# Patient Record
Sex: Female | Born: 1950 | Race: White | Hispanic: No | Marital: Married | Smoking: Never smoker
Health system: Southern US, Community
[De-identification: ages and names within clinical notes are randomized; demographics above are authoritative.]

## PROBLEM LIST (undated history)

## (undated) DIAGNOSIS — F329 Major depressive disorder, single episode, unspecified: Secondary | ICD-10-CM

## (undated) DIAGNOSIS — F32A Depression, unspecified: Secondary | ICD-10-CM

## (undated) DIAGNOSIS — N920 Excessive and frequent menstruation with regular cycle: Secondary | ICD-10-CM

## (undated) DIAGNOSIS — E042 Nontoxic multinodular goiter: Secondary | ICD-10-CM

## (undated) HISTORY — DX: Excessive and frequent menstruation with regular cycle: N92.0

## (undated) HISTORY — DX: Major depressive disorder, single episode, unspecified: F32.9

## (undated) HISTORY — PX: OTHER SURGICAL HISTORY: SHX169

## (undated) HISTORY — PX: TUBAL LIGATION: SHX77

## (undated) HISTORY — DX: Nontoxic multinodular goiter: E04.2

## (undated) HISTORY — DX: Depression, unspecified: F32.A

## (undated) HISTORY — PX: OOPHORECTOMY: SHX86

---

## 1999-06-15 ENCOUNTER — Encounter: Admission: RE | Admit: 1999-06-15 | Discharge: 1999-06-15 | Payer: Self-pay | Admitting: Obstetrics and Gynecology

## 2000-06-29 ENCOUNTER — Encounter: Payer: Self-pay | Admitting: Obstetrics and Gynecology

## 2000-06-29 ENCOUNTER — Encounter: Admission: RE | Admit: 2000-06-29 | Discharge: 2000-06-29 | Payer: Self-pay | Admitting: Obstetrics and Gynecology

## 2000-10-13 ENCOUNTER — Other Ambulatory Visit: Admission: RE | Admit: 2000-10-13 | Discharge: 2000-10-13 | Payer: Self-pay | Admitting: Gynecology

## 2000-12-27 ENCOUNTER — Other Ambulatory Visit: Admission: RE | Admit: 2000-12-27 | Discharge: 2000-12-27 | Payer: Self-pay | Admitting: Gynecology

## 2000-12-27 ENCOUNTER — Encounter (INDEPENDENT_AMBULATORY_CARE_PROVIDER_SITE_OTHER): Payer: Self-pay | Admitting: Specialist

## 2001-06-26 ENCOUNTER — Inpatient Hospital Stay (HOSPITAL_COMMUNITY): Admission: RE | Admit: 2001-06-26 | Discharge: 2001-06-28 | Payer: Self-pay | Admitting: Gynecology

## 2001-06-26 ENCOUNTER — Encounter (INDEPENDENT_AMBULATORY_CARE_PROVIDER_SITE_OTHER): Payer: Self-pay | Admitting: Specialist

## 2001-06-26 HISTORY — PX: ABDOMINAL HYSTERECTOMY: SHX81

## 2001-07-27 ENCOUNTER — Encounter: Payer: Self-pay | Admitting: Gynecology

## 2001-07-27 ENCOUNTER — Encounter: Admission: RE | Admit: 2001-07-27 | Discharge: 2001-07-27 | Payer: Self-pay | Admitting: Gynecology

## 2001-10-13 ENCOUNTER — Other Ambulatory Visit: Admission: RE | Admit: 2001-10-13 | Discharge: 2001-10-13 | Payer: Self-pay | Admitting: Gynecology

## 2002-08-03 ENCOUNTER — Encounter: Admission: RE | Admit: 2002-08-03 | Discharge: 2002-08-03 | Payer: Self-pay | Admitting: Gynecology

## 2002-08-03 ENCOUNTER — Encounter: Payer: Self-pay | Admitting: Gynecology

## 2002-10-15 ENCOUNTER — Other Ambulatory Visit: Admission: RE | Admit: 2002-10-15 | Discharge: 2002-10-15 | Payer: Self-pay | Admitting: Gynecology

## 2003-09-20 ENCOUNTER — Encounter: Admission: RE | Admit: 2003-09-20 | Discharge: 2003-09-20 | Payer: Self-pay | Admitting: Gynecology

## 2003-10-18 ENCOUNTER — Other Ambulatory Visit: Admission: RE | Admit: 2003-10-18 | Discharge: 2003-10-18 | Payer: Self-pay | Admitting: Gynecology

## 2004-09-25 ENCOUNTER — Encounter: Admission: RE | Admit: 2004-09-25 | Discharge: 2004-09-25 | Payer: Self-pay | Admitting: Gynecology

## 2004-10-21 ENCOUNTER — Other Ambulatory Visit: Admission: RE | Admit: 2004-10-21 | Discharge: 2004-10-21 | Payer: Self-pay | Admitting: Obstetrics and Gynecology

## 2004-11-16 ENCOUNTER — Ambulatory Visit (HOSPITAL_COMMUNITY): Admission: RE | Admit: 2004-11-16 | Discharge: 2004-11-16 | Payer: Self-pay | Admitting: Gastroenterology

## 2005-09-28 ENCOUNTER — Encounter: Admission: RE | Admit: 2005-09-28 | Discharge: 2005-09-28 | Payer: Self-pay | Admitting: Obstetrics and Gynecology

## 2005-10-14 ENCOUNTER — Encounter: Admission: RE | Admit: 2005-10-14 | Discharge: 2005-10-14 | Payer: Self-pay | Admitting: Obstetrics and Gynecology

## 2005-10-22 ENCOUNTER — Other Ambulatory Visit: Admission: RE | Admit: 2005-10-22 | Discharge: 2005-10-22 | Payer: Self-pay | Admitting: Obstetrics and Gynecology

## 2006-09-30 ENCOUNTER — Encounter: Admission: RE | Admit: 2006-09-30 | Discharge: 2006-09-30 | Payer: Self-pay | Admitting: Obstetrics and Gynecology

## 2006-10-26 ENCOUNTER — Other Ambulatory Visit: Admission: RE | Admit: 2006-10-26 | Discharge: 2006-10-26 | Payer: Self-pay | Admitting: Obstetrics and Gynecology

## 2007-10-02 ENCOUNTER — Encounter: Admission: RE | Admit: 2007-10-02 | Discharge: 2007-10-02 | Payer: Self-pay | Admitting: Obstetrics and Gynecology

## 2007-11-09 ENCOUNTER — Other Ambulatory Visit: Admission: RE | Admit: 2007-11-09 | Discharge: 2007-11-09 | Payer: Self-pay | Admitting: Obstetrics and Gynecology

## 2008-10-02 ENCOUNTER — Encounter: Admission: RE | Admit: 2008-10-02 | Discharge: 2008-10-02 | Payer: Self-pay | Admitting: Obstetrics and Gynecology

## 2008-11-11 ENCOUNTER — Other Ambulatory Visit: Admission: RE | Admit: 2008-11-11 | Discharge: 2008-11-11 | Payer: Self-pay | Admitting: Obstetrics and Gynecology

## 2008-11-11 ENCOUNTER — Ambulatory Visit: Payer: Self-pay | Admitting: Obstetrics and Gynecology

## 2008-11-11 ENCOUNTER — Encounter: Payer: Self-pay | Admitting: Obstetrics and Gynecology

## 2008-11-21 ENCOUNTER — Ambulatory Visit: Payer: Self-pay | Admitting: Obstetrics and Gynecology

## 2009-03-25 ENCOUNTER — Encounter: Admission: RE | Admit: 2009-03-25 | Discharge: 2009-03-25 | Payer: Self-pay | Admitting: Gastroenterology

## 2009-09-06 HISTORY — PX: THYROIDECTOMY, PARTIAL: SHX18

## 2009-10-03 ENCOUNTER — Encounter: Admission: RE | Admit: 2009-10-03 | Discharge: 2009-10-03 | Payer: Self-pay | Admitting: Obstetrics and Gynecology

## 2009-11-13 ENCOUNTER — Other Ambulatory Visit: Admission: RE | Admit: 2009-11-13 | Discharge: 2009-11-13 | Payer: Self-pay | Admitting: Obstetrics and Gynecology

## 2009-11-13 ENCOUNTER — Ambulatory Visit: Payer: Self-pay | Admitting: Obstetrics and Gynecology

## 2009-12-02 ENCOUNTER — Ambulatory Visit: Payer: Self-pay | Admitting: Obstetrics and Gynecology

## 2009-12-11 ENCOUNTER — Ambulatory Visit: Payer: Self-pay | Admitting: Obstetrics and Gynecology

## 2010-01-05 ENCOUNTER — Ambulatory Visit (HOSPITAL_COMMUNITY): Admission: RE | Admit: 2010-01-05 | Discharge: 2010-01-05 | Payer: Self-pay | Admitting: Obstetrics and Gynecology

## 2010-04-03 ENCOUNTER — Ambulatory Visit (HOSPITAL_COMMUNITY): Admission: RE | Admit: 2010-04-03 | Discharge: 2010-04-03 | Payer: Self-pay | Admitting: Endocrinology

## 2010-05-18 ENCOUNTER — Ambulatory Visit (HOSPITAL_COMMUNITY)
Admission: RE | Admit: 2010-05-18 | Discharge: 2010-05-19 | Payer: Self-pay | Source: Home / Self Care | Admitting: General Surgery

## 2010-05-18 ENCOUNTER — Encounter (INDEPENDENT_AMBULATORY_CARE_PROVIDER_SITE_OTHER): Payer: Self-pay | Admitting: General Surgery

## 2010-09-27 ENCOUNTER — Encounter: Payer: Self-pay | Admitting: Obstetrics and Gynecology

## 2010-10-06 ENCOUNTER — Encounter
Admission: RE | Admit: 2010-10-06 | Discharge: 2010-10-06 | Payer: Self-pay | Source: Home / Self Care | Attending: Obstetrics and Gynecology | Admitting: Obstetrics and Gynecology

## 2010-11-16 ENCOUNTER — Other Ambulatory Visit: Payer: Self-pay | Admitting: Obstetrics and Gynecology

## 2010-11-16 ENCOUNTER — Encounter (INDEPENDENT_AMBULATORY_CARE_PROVIDER_SITE_OTHER): Payer: Managed Care, Other (non HMO) | Admitting: Obstetrics and Gynecology

## 2010-11-16 ENCOUNTER — Other Ambulatory Visit (HOSPITAL_COMMUNITY)
Admission: RE | Admit: 2010-11-16 | Discharge: 2010-11-16 | Disposition: A | Discharge status: Home / Self Care | Payer: Managed Care, Other (non HMO) | Source: Ambulatory Visit | Attending: Obstetrics and Gynecology | Admitting: Obstetrics and Gynecology

## 2010-11-16 DIAGNOSIS — Z124 Encounter for screening for malignant neoplasm of cervix: Secondary | ICD-10-CM | POA: Insufficient documentation

## 2010-11-16 DIAGNOSIS — Z833 Family history of diabetes mellitus: Secondary | ICD-10-CM

## 2010-11-16 DIAGNOSIS — R82998 Other abnormal findings in urine: Secondary | ICD-10-CM

## 2010-11-16 DIAGNOSIS — Z01419 Encounter for gynecological examination (general) (routine) without abnormal findings: Secondary | ICD-10-CM

## 2010-11-16 DIAGNOSIS — Z13220 Encounter for screening for lipoid disorders: Secondary | ICD-10-CM

## 2010-11-19 LAB — DIFFERENTIAL
Basophils Absolute: 0 10*3/uL (ref 0.0–0.1)
Lymphocytes Relative: 32 % (ref 12–46)
Neutro Abs: 5 10*3/uL (ref 1.7–7.7)

## 2010-11-19 LAB — CBC
Hemoglobin: 13.4 g/dL (ref 12.0–15.0)
MCH: 29.5 pg (ref 26.0–34.0)
MCV: 86.8 fL (ref 78.0–100.0)
RBC: 4.54 MIL/uL (ref 3.87–5.11)

## 2010-11-19 LAB — COMPREHENSIVE METABOLIC PANEL
BUN: 10 mg/dL (ref 6–23)
CO2: 25 mEq/L (ref 19–32)
Chloride: 108 mEq/L (ref 96–112)
Creatinine, Ser: 0.7 mg/dL (ref 0.4–1.2)
GFR calc non Af Amer: >60 mL/min (ref >60)
Total Bilirubin: 0.4 mg/dL (ref 0.3–1.2)

## 2010-11-19 LAB — SURGICAL PCR SCREEN: Staphylococcus aureus: NEGATIVE

## 2011-01-22 NOTE — H&P (Signed)
[facility]  Patient:    Cheyenne Davis, Cheyenne Davis Visit Number: [id number] MRN: [id number]          Service Type: Attending:  Gaetano Hawthorne. Lily Peer, M.D. Dictated by:   Gaetano Hawthorne Lily Peer, M.D. Adm. Date:  06/26/01                           History and Physical  SURGERY DATE:  The patient is scheduled for surgery on Monday, June 26, 2001, at 7:30 a.m. at [facility].  CHIEF COMPLAINT: 1. Pelvic pain. 2. Menometrorrhagia. 3. Dysmenorrhea. 4. Right menometrorrhagia ovarian solid mass measuring 11 x 12 mm.  HISTORY OF PRESENT ILLNESS:  The patient is a 60 year old gravida 2, para 2, with previous sterilization procedure who has a longstanding history of dysfunctional uterine bleeding.  Her work-up has included a TSH and prolactin which was normal and she has also had an endometrial biopsy on December 26, 2000, with normal endometrium.  She has had several ultrasounds to follow up on her hyperechoic solid mass on the right ovary which has essentially remained unchanged, which was measured at 11 x 12 mm and is smoothed wall.  There has been no change in the echo pattern from June of this year to September of this year.  The left ovary was difficult to evaluate recently because it was directly upon the uterus.  She has had CA-125 which was normal.  She had also been placed for her menorrhagia on Provera 10 mg for 5 days of each month, minimal resolution.  She complains of dysmenorrhea and dyspareunia.  We had discussed several options to include laparoscopic salpingo-oophorectomy and resectascopic surgery to include endometrial ablation but due to the finding of this nodule on her ovary and her refractory menorrhagia she has decided to proceed with a hysterectomy and we have decided to proceed then with an abdominal approach, since she is now 60 years of age, concurrently to do a bilateral salpingo-oophorectomy.  She was provided with literature  information on all of the above mentioned subjects.  PAST MEDICAL HISTORY:  She is not allergic to any medication.  She has had 1 C-section and one vaginal delivery and also had her tubes tied.  She also has, in the past, had a thyroid nodule which has decreased in size spontaneously and it has not been any problem.  Her thyroid function tests have been normal.  FAMILY HISTORY:  Grandmother with adult onset diabetes mellitus.  Her father with hypertension.  An uncle with colon cancer and a grandmother with heart disease.  She denies any history of sexually transmitted disease in the past.  PHYSICAL EXAMINATION:  VITAL SIGNS:  The patient weight 188 pounds.  She is 5 feet 2 inches tall.  HEENT:  Unremarkable.  NECK:  Supple.  Trachea midline.  No carotid bruits.  No thyromegaly.  LUNGS:  Clear to auscultation without rhonchis or wheezes.  HEART:  Regular rate and rhythm, no murmurs or gallops.  BREAST:  Done during the time of her annual exam in February of this year which was reported to be normal as was her Pap smear.  ABDOMEN:  Soft, nontender without rebound or guarding.  PELVIC:  Bartholin, Skene, urethra within normal limits.  VAGINA AND CERVIX:  No gross lesion on inspection.  Uterus approximately 6-8 weeks size.  Adnexa:  No palpable masses or tenderness.  RECTAL:  Unremarkable.  ASSESSMENT:  A 60 year old gravida 2, para 2, with  history of dysmenorrhea, menorrhagia, unresponsive to medical therapy and also with a persistent small nodule on the right ovary.  The right ovarian nodule has been stable, possibly a fibroma.  Nevertheless, she was counselled at the time of her hysterectomy that if this were to be a malignancy we would need to proceed with a full staging procedure to include pelvic and para-aortic lymphoidectomy along with partial omentectomy.  The patient will have both ovaries removed at the time of her hysterectomy.  She is fully aware of the potential  risks of the operation to include infection although she will receive prophylactic antibiotics, hemorrhage.  In the event of a blood transfusion there is also the risk for anaphylactic reaction, hepatitis and AIDS.  Also, the risks of trauma to internal abdominal organs, such as bladder, ureter, intestines and nerves and blood vessels which may need to be corrected at the time of the surgery.  In the event of any bladder trauma she is fully aware that she may need to go home with a leg bag to empty her bladder for a couple weeks to allow healing of her bladder as well.  She is also aware that she may still continue to suffer from dyspareunia or lower abdominal pain even after the hysterectomy.  She also is aware that she will need to be placed hormone replacement therapy to prevent osteoporosis after such said procedure.  All of these issues were discussed with the patient and she will also receive pneumatic compression stocking to prevent deep vein thrombosis and pulmonary embolism as well.  All of these issues were discussed with the patient in detail.  Literature was provided and all questions were answered and we will follow accordingly.  PLAN:  The patient is scheduled for total abdominal hysterectomy with bilateral salpingo-oophorectomy, Monday, June 26, 2001, at 7:30 a.m. at [facility]. Dictated by:   Gaetano Hawthorne. Lily Peer, M.D. Attending:  Gaetano Hawthorne. Lily Peer, M.D. DD:  06/23/01 TD:  06/23/01 Job: [id number]

## 2011-01-22 NOTE — Op Note (Signed)
[facility]  Patient:    Cheyenne Davis, Cheyenne Davis Visit Number: [id number] MRN: [id number]          Service Type: GYN Location: 9300 [id number] Attending Physician:  Tonye Royalty Dictated by:   Gaetano Hawthorne. Lily Peer, M.D. Proc. Date: 06/26/01 Admit Date:  06/26/2001                             Operative Report  PREOPERATIVE DIAGNOSES:       1. Right ovarian pelvic mass.                               2. Menometrorrhagia.                               3. Dysmenorrhea.  POSTOPERATIVE DIAGNOSES:      1. Menometrorrhagia.                               2. Dysmenorrhea.                               3. Right ovarian fibroma.  PROCEDURE PERFORMED:          Total abdominal hysterectomy with bilateral salpingo-oophorectomy and lysis of pelvic adhesions.  SURGEON:                      Juan H. Lily Peer, M.D.  FIRST ASSISTANT:              Katy Fitch, M.D.  ANESTHESIA:                   Epidural.  INDICATIONS FOR OPERATION:    A 60 year old gravida 2, para 2 with chronic refractory menometrorrhagia, dysmenorrhea and a right ovarian solid mass.  FINDINGS:                     1. Right ovarian fibroma.                               2. Mild pelvic adhesions.                               3. Normal-appearing left ovary.                               4. Bilateral normal tubes and uterus.  DESCRIPTION OF OPERATION:     After the patient was adequately counseled, she was taken to the operating room, where she successfully underwent epidural placement.  She was placed in the supine position after the abdomen, vagina and perineum were prepped and draped in the usual sterile fashion and a Foley catheter was placed.  A Pfannenstiel skin incision was made 2 cm above the symphysis pubis.  The incision was carried down through the skin and subcutaneous tissue down to the rectus fascia, whereby a midline nick was made.  The fascia was incised in a transverse  fashion.  The midline raphe was entered and the peritoneal cavity was entered cautiously.  The [facility]  retractors were placed.  The patient was then placed in Trendelenburg and both tube and utero-ovarian ligaments were clamped and placed under tension.  The right round ligament was suture ligated with 0 Vicryl suture and then transected.  The anterior broad ligament was incised down to the level of the internal cervical os.  The right ureter was identified and the posterior broad ligament was penetrated with the surgeons finger.  The right infundibulopelvic ligament was clamped, cut and suture ligated, first with a free tie of 0 Vicryl suture, followed by a transfixation stitch.  The right tube and ovary were passed off the operative field for a frozen specimen on the solid that had been identified by ultrasound on the right ovary.  The preliminary verbal report by the pathologist was a benign fibroma.  After skeletonization of the parametrium, the remaining broad ligament and cardinal ligaments were serially clamped, cut and suture ligated with 0 Vicryl suture down to the level of the uterine artery, which was clamped, cut and suture ligated, as well.  Attention was then turned to the patients left adnexa, whereby the round ligament was identified and suture ligated with 0 Vicryl suture and transected.  The anterior broad ligament was incised to the level of the internal cervical os.  The left ureter was identified.  The posterior broad ligament was penetrated and the left infundibulopelvic ligament was clamped, cut and free tied with 0 Vicryl suture followed by a transfixation stitch.  After skeletonization, the remaining broad and cardinal ligaments were serially clamped, cut and suture ligated down to and beyond the level of the uterine artery.  After both angle clamps were in place, the uterus was excised and passed off the operative field.  The remaining cervical stump  after additional straight Kochers placed down to the lateral fornices was able to be amputated and passed off the operative field. The angles were secured with a transfixation stitch of 0 Vicryl suture and the remaining vaginal cuff was closed with an interrupted suture of 0 Vicryl suture.  The pelvic cavity was then copiously irrigated with normal saline solution.  After ascertaining adequate hemostasis, sponges and retractors were removed.  Sponge count and needle count were correct.  The visceral peritoneum was closed with a running stitch of 3-0 Vicryl suture.  The subcutaneous bleeders were Bovie cauterized.  The rectus fascia was closed with 0 Vicryl suture.  The skin was reapproximated with skin clips followed by placement of Xeroform gauze and 4 x dressing.  The patient was transferred to the recovery room with stable vital signs.  Blood loss for the procedure was 50 cc.  Fluid resuscitation consisted of 2600 cc of lactated Ringers.  Urine output was 600 cc and clear.  The patient did receive 1 g of Cefotan preoperatively.  She also had pneumatic compression stockings for DVT prophylaxis. Dictated by:   Gaetano Hawthorne Lily Peer, M.D. Attending Physician:  Tonye Royalty DD:  06/26/01 TD:  06/26/01 Job: [id number]

## 2011-01-22 NOTE — Op Note (Signed)
[name], Cheyenne Davis                 ACCOUNT NO.:  [id number]   MEDICAL RECORD NO.:  [id number]          PATIENT TYPE:  AMB   LOCATION:  ENDO                         FACILITY:  [facility]   PHYSICIAN:  Anselmo Rod, M.D.  DATE OF BIRTH:  November 17, 1950   DATE OF PROCEDURE:  11/16/2004  DATE OF DISCHARGE:                                 OPERATIVE REPORT   PROCEDURE PERFORMED:  Screening colonoscopy.   ENDOSCOPIST:  Anselmo Rod, M.D.   INSTRUMENT USED:  Olympus video colonoscope.   INDICATIONS FOR PROCEDURE:  A 60 year old white female with a family history  of colon cancer undergoing screening colonoscopy to rule out colonic polyps,  masses, etc.   PREPROCEDURE PREPARATION:  Informed consent was procured from the patient.  The patient fasted for eight hours prior to the procedure and prepped with a  bottle of magnesium citrate and a gallon of GoLYTELY the night prior to the  procedure.  Risks and benefits of the procedure including a 10% miss rate of  cancer and polyps was discussed with the patient as well.   DESCRIPTION OF PROCEDURE:  The patient was placed in left lateral decubitus  position, sedated with 60 mg of Demerol and 6 mg of Versed in slow  incremental doses.  Once the patient was adequately sedated and maintained  on low flow oxygen and continuous cardiac monitoring, the Olympus video  colonoscope was advanced from the rectum to the cecum.  The appendiceal  orifice and ileocecal valve were clearly visualized and photographed.  There  was a large amount of residual stool in the colon.  Multiple washings were  done.  A few early sigmoid diverticula were seen.  No masses or polyps were  identified. Prominent internal hemorrhoids were seen on retroflexion in the  rectum.  The patient tolerated the procedure well without any immediate  complications.   IMPRESSION:  1.  Small nonbleeding internal hemorrhoids.  2.  Few early sigmoid diverticula.  3.  No masses or polyps  seen.  4.  Large amount of residual stool in the colon, small lesions could have      been missed.   RECOMMENDATIONS:  1.  Repeat colonoscopy has been recommended in the next five years unless      the patient develops any abnormal symptoms in the interim.  2.  High fiber diet with liberal fluid intake.  3.  Brochures on diverticulosis have been given to the patient for her      education.  4.  Outpatient follow-up as need arises in the future.      JNM/MEDQ  D:  11/16/2004  T:  11/16/2004  Job:  [id number]   cc:   Gaetano Hawthorne. Lily Peer, M.D.  [address]  [place]  [place] [address]  Fax: [phone]   Tally Joe, M.D.  [address] [place] [address]  [place], [place] [address]  Fax: [phone]

## 2011-01-22 NOTE — Discharge Summary (Signed)
[facility]  Patient:    Cheyenne Davis, Cheyenne Davis Visit Number: [id number] MRN: [id number]          Service Type: GYN Location: 9300 [id number] 01 Attending Physician:  Tonye Royalty Dictated by:   Antony Contras, [facility] Admit Date:  06/26/2001 Discharge Date: 06/28/2001                             Discharge Summary  DISCHARGE DIAGNOSES:          1. Menometrorrhagia.                               2. Dysmenorrhea.                               3. Right ovarian fibroma.  PROCEDURES:                   Total abdominal hysterectomy with bilateral salpingo-oophorectomy and lysis of pelvic adhesions.  HISTORY OF PRESENT ILLNESS:   The patient is a 60 year old gravida 2, para 2, with a history of previous tubal sterilization.  The patient has a long-standing history of dysfunction uterine bleeding.  Workup has included TSH, prolactin which was normal.  Endometrial biopsy was normal.  Several ultrasounds to follow a hypoechoic solid mass in the right ovary, which essentially remained unchanged.  Left ovary was difficult to evaluate because it was directly upon the uterus.  She had a normal CA 125.  She had been also managed with Provera 10 mg for five days each month with minimal resolution.  PAST MEDICAL HISTORY:         1. History of previous cesarean section x 1.                               2. Delivery x 1.                               3. Tubal sterilization.                               4. History of thyroid nodule which has decreased                                  in size spontaneously, with normal thyroid                                  function tests.  HOSPITAL COURSE:              The patient was admitted on June 26, 2001. Total abdominal hysterectomy, bilateral salpingo-oophorectomy, and lysis of pelvic adhesions was performed by Dr. Lily Peer, assisted by Dr. Penni Homans. Findings included right ovarian fibroma, mild pelvic  adhesions, normal-appearing left ovary, bilateral normal tubes and uterus.  Postoperative course:  The patient remained afebrile, was able to be discharged in satisfactory condition on her second postoperative day.  CBC:  Hematocrit 30.4, hemoglobin 10.9, WBC 11.4, platelets 194.  FOLLOW-UP:  On July 02, 2001, in the office to have staples removed.  MEDICATIONS:                  The patient was also placed on:                               1. Transdermal estradiol.                               2. Climera 0.1 mg weekly.                               3. Lortab 7.5 q.4-6h. p.r.n. for pain. Dictated by:   Antony Contras, [facility] Attending Physician:  Tonye Royalty DD:  07/14/01 TD:  07/16/01 Job: [id number]

## 2011-02-11 ENCOUNTER — Other Ambulatory Visit (INDEPENDENT_AMBULATORY_CARE_PROVIDER_SITE_OTHER): Payer: Managed Care, Other (non HMO)

## 2011-02-11 DIAGNOSIS — M810 Age-related osteoporosis without current pathological fracture: Secondary | ICD-10-CM

## 2011-02-24 ENCOUNTER — Encounter (HOSPITAL_COMMUNITY): Payer: Managed Care, Other (non HMO) | Attending: Obstetrics and Gynecology

## 2011-02-24 DIAGNOSIS — M810 Age-related osteoporosis without current pathological fracture: Secondary | ICD-10-CM | POA: Insufficient documentation

## 2011-09-29 ENCOUNTER — Other Ambulatory Visit: Payer: Self-pay | Admitting: Obstetrics and Gynecology

## 2011-09-29 DIAGNOSIS — Z1231 Encounter for screening mammogram for malignant neoplasm of breast: Secondary | ICD-10-CM

## 2011-10-08 ENCOUNTER — Ambulatory Visit
Admission: RE | Admit: 2011-10-08 | Discharge: 2011-10-08 | Disposition: A | Discharge status: Home / Self Care | Payer: Managed Care, Other (non HMO) | Source: Ambulatory Visit | Attending: Obstetrics and Gynecology | Admitting: Obstetrics and Gynecology

## 2011-10-08 DIAGNOSIS — Z1231 Encounter for screening mammogram for malignant neoplasm of breast: Secondary | ICD-10-CM

## 2011-11-16 DIAGNOSIS — F329 Major depressive disorder, single episode, unspecified: Secondary | ICD-10-CM | POA: Insufficient documentation

## 2011-11-16 DIAGNOSIS — E042 Nontoxic multinodular goiter: Secondary | ICD-10-CM | POA: Insufficient documentation

## 2011-11-16 DIAGNOSIS — N920 Excessive and frequent menstruation with regular cycle: Secondary | ICD-10-CM | POA: Insufficient documentation

## 2011-11-25 ENCOUNTER — Ambulatory Visit (INDEPENDENT_AMBULATORY_CARE_PROVIDER_SITE_OTHER): Payer: Managed Care, Other (non HMO) | Admitting: Obstetrics and Gynecology

## 2011-11-25 ENCOUNTER — Encounter: Payer: Self-pay | Admitting: Obstetrics and Gynecology

## 2011-11-25 VITALS — BP 124/80 | Ht 61.0 in | Wt 178.0 lb

## 2011-11-25 DIAGNOSIS — E7800 Pure hypercholesterolemia, unspecified: Secondary | ICD-10-CM

## 2011-11-25 DIAGNOSIS — Z833 Family history of diabetes mellitus: Secondary | ICD-10-CM

## 2011-11-25 DIAGNOSIS — Z01419 Encounter for gynecological examination (general) (routine) without abnormal findings: Secondary | ICD-10-CM

## 2011-11-25 LAB — LIPID PANEL
Cholesterol: 229 mg/dL — ABNORMAL HIGH (ref 0–200)
HDL: 52 mg/dL (ref >39)
Triglycerides: 159 mg/dL — ABNORMAL HIGH (ref <150)

## 2011-11-25 NOTE — Progress Notes (Signed)
Patient came to see me today for her annual GYN exam. She is now done 2 years of IV Reclast for osteoporosis. She's had no side effects. She scheduled for a bone density in early April. Assuming it shows improvement she is scheduled for a third year of IV Reclast. She's having no pelvic pain. She is having no vaginal bleeding. She is having no bladder symptoms. She fasted  today and we plan to recheck her cholesterol which was slightly elevated last year.  Her total cholesterol was 222 with an elevated LDL of 139. Her other numbers were normal.  HEENT: Within normal limits. Kennon Portela present. Neck: No masses. Supraclavicular lymph nodes: Not enlarged. Breasts: Examined in both sitting and lying position. Symmetrical without skin changes or masses. Abdomen: Soft no masses guarding or rebound. No hernias. Pelvic: External within normal limits. BUS within normal limits. Vaginal examination shows good estrogen effect, no cystocele enterocele or rectocele. Cervix and uterus absent. Adnexa within normal limits. Rectovaginal confirmatory. Extremities within normal limits.  Assessment: #1. Osteoporosis #2. Elevated cholesterol  Plan: Continue yearly mammograms. Bone density. Lipid  profile. We will probably proceed with IV Reclast after bone density.

## 2011-11-26 LAB — CBC WITH DIFFERENTIAL/PLATELET
Eosinophils Absolute: 0.1 10*3/uL (ref 0.0–0.7)
Hemoglobin: 13.9 g/dL (ref 12.0–15.0)
Lymphocytes Relative: 37 % (ref 12–46)
Lymphs Abs: 2.6 10*3/uL (ref 0.7–4.0)
MCH: 30 pg (ref 26.0–34.0)
Monocytes Relative: 5 % (ref 3–12)
Neutrophils Relative %: 56 % (ref 43–77)
RBC: 4.63 MIL/uL (ref 3.87–5.11)
WBC: 7 10*3/uL (ref 4.0–10.5)

## 2011-11-26 LAB — HEMOGLOBIN A1C
Hgb A1c MFr Bld: 5.9 % — ABNORMAL HIGH (ref <5.7)
Mean Plasma Glucose: 123 mg/dL — ABNORMAL HIGH (ref <117)

## 2011-11-26 LAB — URINALYSIS W MICROSCOPIC + REFLEX CULTURE
Bilirubin Urine: NEGATIVE
Leukocytes, UA: NEGATIVE
Protein, ur: NEGATIVE mg/dL
RBC / HPF: NONE SEEN RBC/hpf (ref <3)
Urobilinogen, UA: 0.2 mg/dL (ref 0.0–1.0)

## 2011-11-29 ENCOUNTER — Other Ambulatory Visit: Payer: Self-pay

## 2011-11-29 DIAGNOSIS — N390 Urinary tract infection, site not specified: Secondary | ICD-10-CM

## 2011-11-29 MED ORDER — AMOXICILLIN 250 MG PO CAPS: 250.0000 mg | ORAL_CAPSULE | Freq: Three times a day (TID) | ORAL | Status: AC

## 2011-11-29 MED ORDER — AMOXICILLIN 250 MG PO CAPS: 250.0000 mg | ORAL_CAPSULE | Freq: Three times a day (TID) | ORAL | Status: DC

## 2011-11-29 NOTE — Progress Notes (Signed)
Addended by: Keenan Bachelor on: 11/29/2011 10:26 AM   Modules accepted: Orders

## 2011-12-07 ENCOUNTER — Other Ambulatory Visit: Payer: Managed Care, Other (non HMO)

## 2011-12-07 ENCOUNTER — Other Ambulatory Visit: Payer: Self-pay | Admitting: Obstetrics and Gynecology

## 2011-12-07 DIAGNOSIS — N390 Urinary tract infection, site not specified: Secondary | ICD-10-CM

## 2011-12-07 LAB — URINALYSIS W MICROSCOPIC + REFLEX CULTURE
Ketones, ur: NEGATIVE mg/dL
Nitrite: NEGATIVE
Protein, ur: NEGATIVE mg/dL
Urobilinogen, UA: 0.2 mg/dL (ref 0.0–1.0)
pH: 6.5 (ref 5.0–8.0)

## 2011-12-08 ENCOUNTER — Other Ambulatory Visit: Payer: Managed Care, Other (non HMO)

## 2011-12-08 LAB — URINE CULTURE
Colony Count: NO GROWTH
Organism ID, Bacteria: NO GROWTH

## 2011-12-13 ENCOUNTER — Other Ambulatory Visit: Payer: Self-pay | Admitting: Obstetrics and Gynecology

## 2011-12-16 ENCOUNTER — Ambulatory Visit (INDEPENDENT_AMBULATORY_CARE_PROVIDER_SITE_OTHER): Payer: Managed Care, Other (non HMO)

## 2011-12-16 DIAGNOSIS — M810 Age-related osteoporosis without current pathological fracture: Secondary | ICD-10-CM

## 2012-02-18 ENCOUNTER — Other Ambulatory Visit: Payer: Self-pay | Admitting: *Deleted

## 2012-02-18 ENCOUNTER — Telehealth: Payer: Self-pay | Admitting: *Deleted

## 2012-02-18 DIAGNOSIS — M810 Age-related osteoporosis without current pathological fracture: Secondary | ICD-10-CM

## 2012-02-18 NOTE — Telephone Encounter (Signed)
Patient informed time for Reclast.  Set up appt for labs on 02/22/12.  Will set up infusion after labs are back.

## 2012-02-22 ENCOUNTER — Other Ambulatory Visit: Payer: Managed Care, Other (non HMO)

## 2012-02-22 DIAGNOSIS — M810 Age-related osteoporosis without current pathological fracture: Secondary | ICD-10-CM

## 2012-02-22 LAB — CREATININE, SERUM: Creat: 0.73 mg/dL (ref 0.50–1.10)

## 2012-02-22 LAB — CALCIUM: Calcium: 9.5 mg/dL (ref 8.4–10.5)

## 2012-02-25 NOTE — Telephone Encounter (Signed)
Patient informed labs wnl. Set up Reclast for 03/03/12 @ 9am.

## 2012-03-02 ENCOUNTER — Other Ambulatory Visit (HOSPITAL_COMMUNITY): Payer: Self-pay | Admitting: *Deleted

## 2012-03-03 ENCOUNTER — Encounter (HOSPITAL_COMMUNITY)
Admission: RE | Admit: 2012-03-03 | Discharge: 2012-03-03 | Disposition: A | Discharge status: Home / Self Care | Payer: Managed Care, Other (non HMO) | Source: Ambulatory Visit | Attending: Obstetrics and Gynecology | Admitting: Obstetrics and Gynecology

## 2012-03-03 DIAGNOSIS — M810 Age-related osteoporosis without current pathological fracture: Secondary | ICD-10-CM | POA: Insufficient documentation

## 2012-03-03 MED ORDER — ZOLEDRONIC ACID 5 MG/100ML IV SOLN
5.0000 mg | Freq: Once | INTRAVENOUS | Status: AC
  Administered 2012-03-03: 5 mg via INTRAVENOUS
  Filled 2012-03-03: qty 100

## 2012-10-10 ENCOUNTER — Other Ambulatory Visit: Payer: Self-pay | Admitting: Obstetrics and Gynecology

## 2012-10-10 DIAGNOSIS — Z1231 Encounter for screening mammogram for malignant neoplasm of breast: Secondary | ICD-10-CM

## 2012-10-16 ENCOUNTER — Ambulatory Visit
Admission: RE | Admit: 2012-10-16 | Discharge: 2012-10-16 | Disposition: A | Discharge status: Home / Self Care | Payer: Federal, State, Local not specified - PPO | Source: Ambulatory Visit | Attending: Obstetrics and Gynecology | Admitting: Obstetrics and Gynecology

## 2012-10-16 DIAGNOSIS — Z1231 Encounter for screening mammogram for malignant neoplasm of breast: Secondary | ICD-10-CM

## 2012-12-06 ENCOUNTER — Ambulatory Visit (INDEPENDENT_AMBULATORY_CARE_PROVIDER_SITE_OTHER): Payer: Self-pay | Admitting: Gynecology

## 2012-12-06 ENCOUNTER — Encounter: Payer: Self-pay | Admitting: Gynecology

## 2012-12-06 VITALS — BP 130/82 | Ht 61.0 in | Wt 183.0 lb

## 2012-12-06 DIAGNOSIS — R635 Abnormal weight gain: Secondary | ICD-10-CM

## 2012-12-06 DIAGNOSIS — Z860100 Personal history of colon polyps, unspecified: Secondary | ICD-10-CM

## 2012-12-06 DIAGNOSIS — M810 Age-related osteoporosis without current pathological fracture: Secondary | ICD-10-CM

## 2012-12-06 DIAGNOSIS — Z01419 Encounter for gynecological examination (general) (routine) without abnormal findings: Secondary | ICD-10-CM

## 2012-12-06 LAB — COMPREHENSIVE METABOLIC PANEL
ALT: 18 U/L (ref 0–35)
AST: 20 U/L (ref 0–37)
Albumin: 4.3 g/dL (ref 3.5–5.2)
BUN: 19 mg/dL (ref 6–23)
CO2: 27 mEq/L (ref 19–32)
Calcium: 9.2 mg/dL (ref 8.4–10.5)
Chloride: 105 mEq/L (ref 96–112)
Potassium: 4.1 mEq/L (ref 3.5–5.3)

## 2012-12-06 LAB — CBC WITH DIFFERENTIAL/PLATELET
Basophils Absolute: 0 10*3/uL (ref 0.0–0.1)
Basophils Relative: 0 % (ref 0–1)
Eosinophils Absolute: 0.1 10*3/uL (ref 0.0–0.7)
MCH: 29.9 pg (ref 26.0–34.0)
MCHC: 34.6 g/dL (ref 30.0–36.0)
Neutro Abs: 3.2 10*3/uL (ref 1.7–7.7)
Neutrophils Relative %: 55 % (ref 43–77)
Platelets: 236 10*3/uL (ref 150–400)
RBC: 4.52 MIL/uL (ref 3.87–5.11)

## 2012-12-06 LAB — LIPID PANEL: Cholesterol: 222 mg/dL — ABNORMAL HIGH (ref 0–200)

## 2012-12-06 NOTE — Progress Notes (Signed)
Cheyenne Davis 11-Jun-1951 [id number]   History:    62 y.o.  for annual gyn exam with no major complaints today. Review of patient's records as follows:  TAH/BSO 2002 Reclast  initiated 2011 secondary to osteoporosis (patient cannot tolerate Evista, Actonel or Boniva) Bone density 2013: Compare with 2011 significant improvement in the AP spine. Significant decrease of total left hip -8.7%. No significant decrease of right total hip T score. Colonoscopy 2011 past history of colon polyps Mammogram normal 2014 Multinodular goiter been followed by Dr. Leslie Dales (Past history partial thyroidectomy)  Patient on no hormone replacement therapy/ asymptomatic  Patient's primary physician is Dr. Janace Litten for which she has not seen in over year. Patient states she is taking her calcium and vitamin D. She received her Tdap vaccine in 2013. She has not received her shingles vaccine.    Past medical history,surgical history, family history and social history were all reviewed and documented in the EPIC chart.  Gynecologic History No LMP recorded. Patient has had a hysterectomy. Contraception: post menopausal status Last Pap: 2012. Results were: normal Last mammogram: 2014. Results were: normal  Obstetric History OB History   Grav Para Term Preterm Abortions TAB SAB Ect Mult Living   2 2 2   0     2     # Outc Date GA Lbr Len/2nd Wgt Sex Del Anes PTL Lv   1 TRM            2 TRM                ROS: A ROS was performed and pertinent positives and negatives are included in the history.  GENERAL: No fevers or chills. HEENT: No change in vision, no earache, sore throat or sinus congestion. NECK: No pain or stiffness. CARDIOVASCULAR: No chest pain or pressure. No palpitations. PULMONARY: No shortness of breath, cough or wheeze. GASTROINTESTINAL: No abdominal pain, nausea, vomiting or diarrhea, melena or bright red blood per rectum. GENITOURINARY: No urinary frequency, urgency, hesitancy or dysuria.  MUSCULOSKELETAL: No joint or muscle pain, no back pain, no recent trauma. DERMATOLOGIC: No rash, no itching, no lesions. ENDOCRINE: No polyuria, polydipsia, no heat or cold intolerance. No recent change in weight. HEMATOLOGICAL: No anemia or easy bruising or bleeding. NEUROLOGIC: No headache, seizures, numbness, tingling or weakness. PSYCHIATRIC: No depression, no loss of interest in normal activity or change in sleep pattern.     Exam: chaperone present  BP 130/82  Ht 5\' 1"  (1.549 m)  Wt 183 lb (83.008 kg)  BMI 34.6 kg/m2  Body mass index is 34.6 kg/(m^2).  General appearance : Well developed well nourished female. No acute distress HEENT: Neck supple, trachea midline, no carotid bruits, no thyroidmegaly Lungs: Clear to auscultation, no rhonchi or wheezes, or rib retractions  Heart: Regular rate and rhythm, no murmurs or gallops Breast:Examined in sitting and supine position were symmetrical in appearance, no palpable masses or tenderness,  no skin retraction, no nipple inversion, no nipple discharge, no skin discoloration, no axillary or supraclavicular lymphadenopathy Abdomen: no palpable masses or tenderness, no rebound or guarding Extremities: no edema or skin discoloration or tenderness  Pelvic:  Bartholin, Urethra, Skene Glands: Within normal limits             Vagina: No gross lesions or discharge, first degree rectocele  Cervix: absent  Uterus  Absent  Adnexa  Without masses or tenderness  Anus and perineum  normal   Rectovaginal  normal sphincter tone without palpated masses or tenderness  Hemoccult Cards provided     Assessment/Plan:  62 y.o. female for annual exam with history of osteoporosis. Reclast started in 2011 yearly IV infusion. Patient is due for her next Reclast in June 2014. The following labs were ordered today: Fasting lipid profile, competence metabolic panel, CBC, urinalysis, and vitamin D level. Patient's endocrinologist did patient's thyroid  function test recently. No Pap smear done today Screening guidelines discussed. Patient was given a requisition to schedule to get her shingles vaccine. We discussed about the importance of calcium and vitamin D and regular exercise. She will need her next bone density study in 2014.    Ok Edwards MD, 10:39 AM 12/06/2012

## 2012-12-06 NOTE — Patient Instructions (Addendum)
Shingles Shingles is caused by the same virus that causes chickenpox (varicella zoster virus or VZV). Shingles often occurs many years or decades after having chickenpox. That is why it is more common in adults older than 50 years. The virus reactivates and breaks out as an infection in a nerve root. SYMPTOMS   The initial feeling (sensations) may be pain. This pain is usually described as:  Burning.  Stabbing.  Throbbing.  Tingling in the nerve root.  A red rash will follow in a couple days. The rash may occur in any area of the body and is usually on one side (unilateral) of the body in a band or belt-like pattern. The rash usually starts out as very small blisters (vesicles). They will dry up after 7 to 10 days. This is not usually a significant problem except for the pain it causes.  Long-lasting (chronic) pain is more likely in an elderly person. It can last months to years. This condition is called postherpetic neuralgia. Shingles can be an extremely severe infection in someone with AIDS, a weakened immune system, or with forms of leukemia. It can also be severe if you are taking transplant medicines or other medicines that weaken the immune system. TREATMENT  Your caregiver will often treat you with:  Antiviral drugs.  Anti-inflammatory drugs.  Pain medicines. Bed rest is very important in preventing the pain associated with herpes zoster (postherpetic neuralgia). Application of heat in the form of a hot water bottle or electric heating pad or gentle pressure with the hand is recommended to help with the pain or discomfort. PREVENTION  A varicella zoster vaccine is available to help protect against the virus. The Food and Drug Administration approved the varicella zoster vaccine for individuals 73 years of age and older. HOME CARE INSTRUCTIONS   Cool compresses to the area of rash may be helpful.  Only take over-the-counter or prescription medicines for pain, discomfort, or  fever as directed by your caregiver.  Avoid contact with:  Babies.  Pregnant women.  Children with eczema.  Elderly people with transplants.  People with chronic illnesses, such as leukemia and AIDS.  If the area involved is on your face, you may receive a referral for follow-up to a specialist. It is very important to keep all follow-up appointments. This will help avoid eye complications, chronic pain, or disability. SEEK IMMEDIATE MEDICAL CARE IF:   You develop any pain (headache) in the area of the face or eye. This must be followed carefully by your caregiver or ophthalmologist. An infection in part of your eye (cornea) can be very serious. It could lead to blindness.  You do not have pain relief from prescribed medicines.  Your redness or swelling spreads.  The area involved becomes very swollen and painful.  You have a fever.  You notice any red or painful lines extending away from the affected area toward your heart (lymphangitis).  Your condition is worsening or has changed. Document Released: 08/23/2005 Document Revised: 11/15/2011 Document Reviewed: 07/28/2009 [facility] Patient Information 2013 [place], [place].                                            Patient information: High cholesterol (The Basics)  What is cholesterol? - Cholesterol is a substance that is found in the blood. Everyone has some. It is needed for good health. The problem is, people sometimes  have too much cholesterol. Compared with people with normal cholesterol, people with high cholesterol have a higher risk of heart attacks, strokes, and other health problems. The higher your cholesterol, the higher your risk of these problems. Cholesterol levels in your body are determined significantly by your diet. Cholesterol levels may also be related to heart disease. The following material helps to explain this relationship and discusses what you can do to help keep your heart healthy. Not all cholesterol is  bad. Low-density lipoprotein (LDL) cholesterol is the "bad" cholesterol. It may cause fatty deposits to build up inside your arteries. High-density lipoprotein (HDL) cholesterol is "good." It helps to remove the "bad" LDL cholesterol from your blood. Cholesterol is a very important risk factor for heart disease. Other risk factors are high blood pressure, smoking, stress, heredity, and weight.  The heart muscle gets its supply of blood through the coronary arteries. If your LDL cholesterol is high and your HDL cholesterol is low, you are at risk for having fatty deposits build up in your coronary arteries. This leaves less room through which blood can flow. Without sufficient blood and oxygen, the heart muscle cannot function properly and you may feel chest pains (angina pectoris). When a coronary artery closes up entirely, a part of the heart muscle may die, causing a heart attack (myocardial infarction).  CHECKING CHOLESTEROL When your caregiver sends your blood to a lab to be analyzed for cholesterol, a complete lipid (fat) profile may be done. With this test, the total amount of cholesterol and levels of LDL and HDL are determined. Triglycerides are a type of fat that circulates in the blood and can also be used to determine heart disease risk. Are there different types of cholesterol? - Yes, there are a few different types. If you get a cholesterol test, you may hear your doctor or nurse talk about: Total cholesterol  LDL cholesterol - Some people call this the "bad" cholesterol. That's because having high LDL levels raises your risk of heart attacks, strokes, and other health problems.  HDL cholesterol - Some people call this the "good" cholesterol. That's because having high HDL levels lowers your risk of heart attacks, strokes, and other health problems.  Non-HDL cholesterol - Non-HDL cholesterol is your total cholesterol minus your HDL cholesterol.  Triglycerides - Triglycerides are not  cholesterol. They are a type of fat. But they often get measured when cholesterol is measured. (Having high triglycerides also seems to increase the risk of heart attacks and strokes.)   Keep in mind, though, that many people who cannot meet these goals still have a low risk of heart attacks and strokes. What should I do if my doctor tells me I have high cholesterol? - Ask your doctor what your overall risk of heart attacks and strokes is. High cholesterol, by itself, is not always a reason to worry. Having high cholesterol is just one of many things that can increase your risk of heart attacks and strokes. Other factors that increase your risk include:  Cigarette smoking  High blood pressure  Having a parent, sister, or brother who got heart disease at a young age (Young, in this case, means younger than 22 for men and younger than 17 for women.)  Being a man (Women are at risk, too, but men have a higher risk.)  Older age  If you are at high risk of heart attacks and strokes, having high cholesterol is a problem. On the other hand, if you have are at  low risk, having high cholesterol may not mean much. Should I take medicine to lower cholesterol? - Not everyone who has high cholesterol needs medicines. Your doctor or nurse will decide if you need them based on your age, family history, and other health concerns.  You should probably take a cholesterol-lowering medicine called a statin if you: Already had a heart attack or stroke  Have known heart disease  Have diabetes  Have a condition called peripheral artery disease, which makes it painful to walk, and happens when the arteries in your legs get clogged with fatty deposits  Have an abdominal aortic aneurysm, which is a widening of the main artery in the belly  Most people with any of the conditions listed above should take a statin no matter what their cholesterol level is. If your doctor or nurse puts you on a statin, stay on it. The medicine  may not make you feel any different. But it can help prevent heart attacks, strokes, and death.  Can I lower my cholesterol without medicines? - Yes, you can lower your cholesterol some by:  Avoiding red meat, butter, fried foods, cheese, and other foods that have a lot of saturated fat  Losing weight (if you are overweight)  Being more active Even if these steps do little to change your cholesterol, they can improve your health in many ways.                                                   Cholesterol Control Diet  CONTROLLING CHOLESTEROL WITH DIET Although exercise and lifestyle factors are important, your diet is key. That is because certain foods are known to raise cholesterol and others to lower it. The goal is to balance foods for their effect on cholesterol and more importantly, to replace saturated and trans fat with other types of fat, such as monounsaturated fat, polyunsaturated fat, and omega-3 fatty acids. On average, a person should consume no more than 15 to 17 g of saturated fat daily. Saturated and trans fats are considered "bad" fats, and they will raise LDL cholesterol. Saturated fats are primarily found in animal products such as meats, butter, and cream. However, that does not mean you need to sacrifice all your favorite foods. Today, there are good tasting, low-fat, low-cholesterol substitutes for most of the things you like to eat. Choose low-fat or nonfat alternatives. Choose round or loin cuts of red meat, since these types of cuts are lowest in fat and cholesterol. Chicken (without the skin), fish, veal, and ground [place] breast are excellent choices. Eliminate fatty meats, such as hot dogs and salami. Even shellfish have little or no saturated fat. Have a 3 oz (85 g) portion when you eat lean meat, poultry, or fish. Trans fats are also called "partially hydrogenated oils." They are oils that have been scientifically manipulated so that they are solid at room temperature  resulting in a longer shelf life and improved taste and texture of foods in which they are added. Trans fats are found in stick margarine, some tub margarines, cookies, crackers, and baked goods.  When baking and cooking, oils are an excellent substitute for butter. The monounsaturated oils are especially beneficial since it is believed they lower LDL and raise HDL. The oils you should avoid entirely are saturated tropical oils, such as coconut and palm.  Remember to eat liberally from food groups that are naturally free of saturated and trans fat, including fish, fruit, vegetables, beans, grains (barley, rice, couscous, bulgur wheat), and pasta (without cream sauces).  IDENTIFYING FOODS THAT LOWER CHOLESTEROL  Soluble fiber may lower your cholesterol. This type of fiber is found in fruits such as apples, vegetables such as broccoli, potatoes, and carrots, legumes such as beans, peas, and lentils, and grains such as barley. Foods fortified with plant sterols (phytosterol) may also lower cholesterol. You should eat at least 2 g per day of these foods for a cholesterol lowering effect.  Read package labels to identify low-saturated fats, trans fats free, and low-fat foods at the supermarket. Select cheeses that have only 2 to 3 g saturated fat per ounce. Use a heart-healthy tub margarine that is free of trans fats or partially hydrogenated oil. When buying baked goods (cookies, crackers), avoid partially hydrogenated oils. Breads and muffins should be made from whole grains (whole-wheat or whole oat flour, instead of "flour" or "enriched flour"). Buy non-creamy canned soups with reduced salt and no added fats.  FOOD PREPARATION TECHNIQUES  Never deep-fry. If you must fry, either stir-fry, which uses very little fat, or use non-stick cooking sprays. When possible, broil, bake, or roast meats, and steam vegetables. Instead of dressing vegetables with butter or margarine, use lemon and herbs, applesauce and  cinnamon (for squash and sweet potatoes), nonfat yogurt, salsa, and low-fat dressings for salads.  LOW-SATURATED FAT / LOW-FAT FOOD SUBSTITUTES Meats / Saturated Fat (g)  Avoid: Steak, marbled (3 oz/85 g) / 11 g   Choose: Steak, lean (3 oz/85 g) / 4 g   Avoid: Hamburger (3 oz/85 g) / 7 g   Choose: Hamburger, lean (3 oz/85 g) / 5 g   Avoid: Ham (3 oz/85 g) / 6 g   Choose: Ham, lean cut (3 oz/85 g) / 2.4 g   Avoid: Chicken, with skin, dark meat (3 oz/85 g) / 4 g   Choose: Chicken, skin removed, dark meat (3 oz/85 g) / 2 g   Avoid: Chicken, with skin, light meat (3 oz/85 g) / 2.5 g   Choose: Chicken, skin removed, light meat (3 oz/85 g) / 1 g  Dairy / Saturated Fat (g)  Avoid: Whole milk (1 cup) / 5 g   Choose: Low-fat milk, 2% (1 cup) / 3 g   Choose: Low-fat milk, 1% (1 cup) / 1.5 g   Choose: Skim milk (1 cup) / 0.3 g   Avoid: Hard cheese (1 oz/28 g) / 6 g   Choose: Skim milk cheese (1 oz/28 g) / 2 to 3 g   Avoid: Cottage cheese, 4% fat (1 cup) / 6.5 g   Choose: Low-fat cottage cheese, 1% fat (1 cup) / 1.5 g   Avoid: Ice cream (1 cup) / 9 g   Choose: Sherbet (1 cup) / 2.5 g   Choose: Nonfat frozen yogurt (1 cup) / 0.3 g   Choose: Frozen fruit bar / trace   Avoid: Whipped cream (1 tbs) / 3.5 g   Choose: Nondairy whipped topping (1 tbs) / 1 g  Condiments / Saturated Fat (g)  Avoid: Mayonnaise (1 tbs) / 2 g   Choose: Low-fat mayonnaise (1 tbs) / 1 g   Avoid: Butter (1 tbs) / 7 g   Choose: Extra light margarine (1 tbs) / 1 g   Avoid: Coconut oil (1 tbs) / 11.8 g   Choose: Olive oil (1 tbs) /  1.8 g   Choose: Corn oil (1 tbs) / 1.7 g   Choose: Safflower oil (1 tbs) / 1.2 g   Choose: Sunflower oil (1 tbs) / 1.4 g   Choose: Soybean oil (1 tbs) / 2.4 g   Choose: Canola oil (1 tbs) / 1 g  Exercise to Lose Weight Exercise and a healthy diet may help you lose weight. Your doctor may suggest specific exercises. EXERCISE IDEAS AND TIPS Choose low-cost  things you enjoy doing, such as walking, bicycling, or exercising to workout videos.  Take stairs instead of the elevator.  Walk during your lunch break.  Park your car further away from work or school.  Go to a gym or an exercise class.  Start with 5 to 10 minutes of exercise each day. Build up to 30 minutes of exercise 4 to 6 days a week.  Wear shoes with good support and comfortable clothes.  Stretch before and after working out.  Work out until you breathe harder and your heart beats faster.  Drink extra water when you exercise.  Do not do so much that you hurt yourself, feel dizzy, or get very short of breath.  Exercises that burn about 150 calories: Running 1  miles in 15 minutes.  Playing volleyball for 45 to 60 minutes.  Washing and waxing a car for 45 to 60 minutes.  Playing touch football for 45 minutes.  Walking 1  miles in 35 minutes.  Pushing a stroller 1  miles in 30 minutes.  Playing basketball for 30 minutes.  Raking leaves for 30 minutes.  Bicycling 5 miles in 30 minutes.  Walking 2 miles in 30 minutes.  Dancing for 30 minutes.  Shoveling snow for 15 minutes.  Swimming laps for 20 minutes.  Walking up stairs for 15 minutes.  Bicycling 4 miles in 15 minutes.  Gardening for 30 to 45 minutes.  Jumping rope for 15 minutes.  Washing windows or floors for 45 to 60 minutes.  Document Released: 09/25/2010 Document Revised: 05/05/2011 Document Reviewed: 09/25/2010 [facility] Patient Information 2012 [place], [place].

## 2012-12-07 ENCOUNTER — Other Ambulatory Visit: Payer: Self-pay | Admitting: Gynecology

## 2012-12-07 DIAGNOSIS — E7800 Pure hypercholesterolemia, unspecified: Secondary | ICD-10-CM

## 2012-12-07 LAB — URINALYSIS W MICROSCOPIC + REFLEX CULTURE
Bilirubin Urine: NEGATIVE
Casts: NONE SEEN
Crystals: NONE SEEN
Glucose, UA: NEGATIVE mg/dL
Specific Gravity, Urine: 1.026 (ref 1.005–1.030)
Squamous Epithelial / LPF: NONE SEEN
Urobilinogen, UA: 0.2 mg/dL (ref 0.0–1.0)
pH: 5 (ref 5.0–8.0)

## 2012-12-07 LAB — VITAMIN D 25 HYDROXY (VIT D DEFICIENCY, FRACTURES): Vit D, 25-Hydroxy: 51 ng/mL (ref 30–89)

## 2012-12-19 ENCOUNTER — Encounter: Payer: Self-pay | Admitting: Gynecology

## 2013-01-05 ENCOUNTER — Other Ambulatory Visit: Payer: Self-pay | Admitting: Anesthesiology

## 2013-01-05 DIAGNOSIS — Z1211 Encounter for screening for malignant neoplasm of colon: Secondary | ICD-10-CM

## 2013-03-27 ENCOUNTER — Telehealth: Payer: Self-pay | Admitting: *Deleted

## 2013-03-27 NOTE — Telephone Encounter (Signed)
Pt was called because of Reclast benefits. Pt was going to have to pay ~$541 for reclast so she asked if there was another option. I advised her that there is an option of Prolia. I did check benefits and it is cheaper for the patient. ~$391. Would Prolia be an option for the patient? Please advise.

## 2013-03-27 NOTE — Telephone Encounter (Signed)
Pt informed and will call her when Prolia comes in office. KW

## 2013-03-27 NOTE — Telephone Encounter (Signed)
The patient was on Reclast since 2011. It is okay for her to go on Prolia 60 mg subcutaneous Q6 months instead  whenever she was due to receive her next dose of Reclast.

## 2013-03-28 NOTE — Telephone Encounter (Signed)
Pt informed and apt tomorrow 930am for Prolia

## 2013-03-29 ENCOUNTER — Ambulatory Visit (INDEPENDENT_AMBULATORY_CARE_PROVIDER_SITE_OTHER): Payer: Federal, State, Local not specified - PPO | Admitting: *Deleted

## 2013-03-29 DIAGNOSIS — M810 Age-related osteoporosis without current pathological fracture: Secondary | ICD-10-CM

## 2013-03-29 MED ORDER — DENOSUMAB 60 MG/ML ~~LOC~~ SOLN
60.0000 mg | Freq: Once | SUBCUTANEOUS | Status: AC
  Administered 2013-03-29: 60 mg via SUBCUTANEOUS

## 2013-05-28 ENCOUNTER — Encounter: Payer: Self-pay | Admitting: Gynecology

## 2013-05-31 ENCOUNTER — Encounter: Payer: Self-pay | Admitting: *Deleted

## 2013-05-31 NOTE — Progress Notes (Signed)
Patient ID: Cheyenne Davis, female   DOB: [birth year], 62 y.o.   MRN: [id number] A letter of medical necessity was sent to the patients insurance for Prolia given in July. Records and benefit verification also sent with letter. Copy of letter will be scanned into EPIC. KW

## 2013-10-08 ENCOUNTER — Telehealth: Payer: Self-pay | Admitting: *Deleted

## 2013-10-08 NOTE — Telephone Encounter (Signed)
Pt was informed time for Prolia. Benefits checked. No PA required. Benefits $350 (0met) then pt responsible for 15%. Total ~$447.50. Pt is ok with that. Will proceed. Prolia apt 10/12/13. KW CMA.

## 2013-10-12 ENCOUNTER — Ambulatory Visit (INDEPENDENT_AMBULATORY_CARE_PROVIDER_SITE_OTHER): Payer: Federal, State, Local not specified - PPO | Admitting: Anesthesiology

## 2013-10-12 DIAGNOSIS — M810 Age-related osteoporosis without current pathological fracture: Secondary | ICD-10-CM

## 2013-10-12 MED ORDER — DENOSUMAB 60 MG/ML ~~LOC~~ SOLN
60.0000 mg | Freq: Once | SUBCUTANEOUS | Status: AC
  Administered 2013-10-12: 60 mg via SUBCUTANEOUS

## 2013-10-15 ENCOUNTER — Other Ambulatory Visit: Payer: Self-pay

## 2013-10-15 DIAGNOSIS — Z1231 Encounter for screening mammogram for malignant neoplasm of breast: Secondary | ICD-10-CM

## 2013-10-25 ENCOUNTER — Ambulatory Visit
Admission: RE | Admit: 2013-10-25 | Discharge: 2013-10-25 | Disposition: A | Discharge status: Home / Self Care | Payer: Federal, State, Local not specified - PPO | Source: Ambulatory Visit

## 2013-10-25 DIAGNOSIS — Z1231 Encounter for screening mammogram for malignant neoplasm of breast: Secondary | ICD-10-CM

## 2013-12-11 ENCOUNTER — Encounter: Payer: Self-pay | Admitting: Gynecology

## 2013-12-11 ENCOUNTER — Ambulatory Visit (INDEPENDENT_AMBULATORY_CARE_PROVIDER_SITE_OTHER): Payer: Federal, State, Local not specified - PPO | Admitting: Gynecology

## 2013-12-11 VITALS — BP 128/82 | Ht 61.25 in | Wt 179.2 lb

## 2013-12-11 DIAGNOSIS — M810 Age-related osteoporosis without current pathological fracture: Secondary | ICD-10-CM

## 2013-12-11 DIAGNOSIS — Z01419 Encounter for gynecological examination (general) (routine) without abnormal findings: Secondary | ICD-10-CM

## 2013-12-11 DIAGNOSIS — Z1159 Encounter for screening for other viral diseases: Secondary | ICD-10-CM

## 2013-12-11 DIAGNOSIS — E042 Nontoxic multinodular goiter: Secondary | ICD-10-CM

## 2013-12-11 LAB — CBC WITH DIFFERENTIAL/PLATELET
BASOS ABS: 0 10*3/uL (ref 0.0–0.1)
BASOS PCT: 0 % (ref 0–1)
EOS ABS: 0.1 10*3/uL (ref 0.0–0.7)
EOS PCT: 1 % (ref 0–5)
HEMATOCRIT: 41.1 % (ref 36.0–46.0)
HEMOGLOBIN: 13.9 g/dL (ref 12.0–15.0)
Lymphocytes Relative: 40 % (ref 12–46)
Lymphs Abs: 3 10*3/uL (ref 0.7–4.0)
MCH: 29.9 pg (ref 26.0–34.0)
MCHC: 33.8 g/dL (ref 30.0–36.0)
MCV: 88.4 fL (ref 78.0–100.0)
MONO ABS: 0.5 10*3/uL (ref 0.1–1.0)
MONOS PCT: 6 % (ref 3–12)
Neutro Abs: 4 10*3/uL (ref 1.7–7.7)
Neutrophils Relative %: 53 % (ref 43–77)
Platelets: 253 10*3/uL (ref 150–400)
RBC: 4.65 MIL/uL (ref 3.87–5.11)
RDW: 14.1 % (ref 11.5–15.5)
WBC: 7.6 10*3/uL (ref 4.0–10.5)

## 2013-12-11 NOTE — Patient Instructions (Signed)
Bone Densitometry Bone densitometry is a special X-ray that measures your bone density and can be used to help predict your risk of bone fractures. This test is used to determine bone mineral content and density to diagnose osteoporosis. Osteoporosis is the loss of bone that may cause the bone to become weak. Osteoporosis commonly occurs in women entering menopause. However, it may be found in men and in people with other diseases. PREPARATION FOR TEST No preparation necessary. WHO SHOULD BE TESTED?  All women older than 65.  Postmenopausal women (50 to 65) with risk factors for osteoporosis.  People with a previous fracture caused by normal activities.  People with a small body frame (less than 127 poundsor a body mass index [BMI] of less than 21).  People who have a parent with a hip fracture or history of osteoporosis.  People who smoke.  People who have rheumatoid arthritis.  Anyone who engages in excessive alcohol use (more than 3 drinks most days).  Women who experience early menopause. WHEN SHOULD YOU BE RETESTED? Current guidelines suggest that you should wait at least 2 years before doing a bone density test again if your first test was normal.Recent studies indicated that women with normal bone density may be able to wait a few years before needing to repeat a bone density test. You should discuss this with your caregiver.  NORMAL FINDINGS   Normal: less than standard deviation below normal (greater than -1).  Osteopenia: 1 to 2.5 standard deviations below normal (-1 to -2.5).  Osteoporosis: greater than 2.5 standard deviations below normal (less than -2.5). Test results are reported as a "T score" and a "Z score."The T score is a number that compares your bone density with the bone density of healthy, young women.The Z score is a number that compares your bone density with the scores of women who are the same age, gender, and race.  Ranges for normal findings may vary  among different laboratories and hospitals. You should always check with your doctor after having lab work or other tests done to discuss the meaning of your test results and whether your values are considered within normal limits. MEANING OF TEST  Your caregiver will go over the test results with you and discuss the importance and meaning of your results, as well as treatment options and the need for additional tests if necessary. OBTAINING THE TEST RESULTS It is your responsibility to obtain your test results. Ask the lab or department performing the test when and how you will get your results. Document Released: 09/14/2004 Document Revised: 11/15/2011 Document Reviewed: 10/07/2010 [employer] Patient Information 2014 [employer].  

## 2013-12-11 NOTE — Progress Notes (Signed)
Cheyenne SkinnerKathy E Davis [birth year] [id number]   History:    63 y.o.  for annual gyn exam with no complaints today. Review of patient's records indicated the following:  TAH/BSO 2002  Reclast initiated 2011 secondary to osteoporosis (patient cannot tolerate Evista, Actonel or Boniva)  Due to cost she is now on Prolia 60 mg subcutaneous every 6 months which were started in July 2014  Bone density 2013: Compare with 2011 significant improvement in the AP spine. Significant decrease of total left hip -8.7%. No significant decrease of right total hip T score.   Colonoscopy 2011 past history of colon polyps  Mammogram normal 2015 Multinodular goiter been followed by Dr. Leslie DalesAltheimer (Past history partial thyroidectomy). She has not seen an ovary year.  Patient on no hormone replacement therapy/ asymptomatic  Patient's primary physician is Dr. Janace LittenGurley for which she has not seen in over year. Patient states she is taking her calcium and vitamin D. She received her Tdap vaccine in 2013. She has now received her shingles vaccine.     Past medical history,surgical history, family history and social history were all reviewed and documented in the EPIC chart.  Gynecologic History No LMP recorded. Patient has had a hysterectomy. Contraception: status post hysterectomy Last Pap: 2014. Results were: normal Last mammogram: 2015. Results were: normal  Obstetric History OB History  Gravida Para Term Preterm AB SAB TAB Ectopic Multiple Living  2 2 2   0     2    # Outcome Date GA Lbr Len/2nd Weight Sex Delivery Anes PTL Lv  2 TRM           1 TRM                ROS: A ROS was performed and pertinent positives and negatives are included in the history.  GENERAL: No fevers or chills. HEENT: No change in vision, no earache, sore throat or sinus congestion. NECK: No pain or stiffness. CARDIOVASCULAR: No chest pain or pressure. No palpitations. PULMONARY: No shortness of breath, cough or wheeze.  GASTROINTESTINAL: No abdominal pain, nausea, vomiting or diarrhea, melena or bright red blood per rectum. GENITOURINARY: No urinary frequency, urgency, hesitancy or dysuria. MUSCULOSKELETAL: No joint or muscle pain, no back pain, no recent trauma. DERMATOLOGIC: No rash, no itching, no lesions. ENDOCRINE: No polyuria, polydipsia, no heat or cold intolerance. No recent change in weight. HEMATOLOGICAL: No anemia or easy bruising or bleeding. NEUROLOGIC: No headache, seizures, numbness, tingling or weakness. PSYCHIATRIC: No depression, no loss of interest in normal activity or change in sleep pattern.     Exam: chaperone present  BP 128/82  Ht 5' 1.25" (1.556 m)  Wt 179 lb 3.2 oz (81.285 kg)  BMI 33.57 kg/m2  Body mass index is 33.57 kg/(m^2).  General appearance : Well developed well nourished female. No acute distress HEENT: Neck supple, trachea midline, no carotid bruits, no thyroidmegaly Lungs: Clear to auscultation, no rhonchi or wheezes, or rib retractions  Heart: Regular rate and rhythm, no murmurs or gallops Breast:Examined in sitting and supine position were symmetrical in appearance, no palpable masses or tenderness,  no skin retraction, no nipple inversion, no nipple discharge, no skin discoloration, no axillary or supraclavicular lymphadenopathy Abdomen: no palpable masses or tenderness, no rebound or guarding Extremities: no edema or skin discoloration or tenderness  Pelvic:  Bartholin, Urethra, Skene Glands: Within normal limits             Vagina: No gross lesions or discharge  Cervix: Absent  Uterus absent  Adnexa  Without masses or tenderness  Anus and perineum  normal   Rectovaginal  normal sphincter tone without palpated masses or tenderness             Hemoccult cards provided     Assessment/Plan:  63 y.o. female for annual exam with history of osteoporosis has received 2 shots of Prolia 60 mg subcutaneous which we had switched because it was too expensive for her to  receive the annual Reclast IV. She will be scheduling her bone density study at the end of this month. We discussed the new Pap smear screening guidelines and she will not need them anymore. She was provided with fecal Hemoccult cards to submit to the office for testing. The following labs were ordered today: Fasting lipid profile, comprehensive metabolic panel, thyroid panel, CBC, urinalysis, vitamin D.  New CDC guidelines is recommending patients be tested once in her lifetime for hepatitis C antibody who were born between [birth year] through 1965. This was discussed with the patient today and has agreed to be tested today.   Note: This dictation was prepared with  Dragon/digital dictation along withSmart phrase technology. Any transcriptional errors that result from this process are unintentional.   Ok Edwards MD, 11:36 AM 12/11/2013

## 2013-12-12 LAB — URINALYSIS W MICROSCOPIC + REFLEX CULTURE
BILIRUBIN URINE: NEGATIVE
Bacteria, UA: NONE SEEN
CASTS: NONE SEEN
Crystals: NONE SEEN
GLUCOSE, UA: NEGATIVE mg/dL
HGB URINE DIPSTICK: NEGATIVE
KETONES UR: NEGATIVE mg/dL
LEUKOCYTES UA: NEGATIVE
Nitrite: NEGATIVE
PH: 7 (ref 5.0–8.0)
PROTEIN: NEGATIVE mg/dL
Specific Gravity, Urine: 1.005 (ref 1.005–1.030)
Squamous Epithelial / LPF: NONE SEEN
Urobilinogen, UA: 0.2 mg/dL (ref 0.0–1.0)

## 2013-12-12 LAB — THYROID PANEL WITH TSH
FREE THYROXINE INDEX: 2.7 (ref 1.0–3.9)
T3 UPTAKE: 31.1 % (ref 22.5–37.0)
T4, Total: 8.7 ug/dL (ref 5.0–12.5)
TSH: 1.986 u[IU]/mL (ref 0.350–4.500)

## 2013-12-12 LAB — LIPID PANEL
CHOLESTEROL: 219 mg/dL — AB (ref 0–200)
HDL: 50 mg/dL (ref >39)
LDL CALC: 138 mg/dL — AB (ref 0–99)
TRIGLYCERIDES: 156 mg/dL — AB (ref <150)
Total CHOL/HDL Ratio: 4.4 Ratio
VLDL: 31 mg/dL (ref 0–40)

## 2013-12-12 LAB — COMPREHENSIVE METABOLIC PANEL
ALBUMIN: 4.3 g/dL (ref 3.5–5.2)
ALK PHOS: 47 U/L (ref 39–117)
ALT: 16 U/L (ref 0–35)
AST: 19 U/L (ref 0–37)
BILIRUBIN TOTAL: 0.4 mg/dL (ref 0.2–1.2)
BUN: 10 mg/dL (ref 6–23)
CO2: 27 meq/L (ref 19–32)
Calcium: 9.3 mg/dL (ref 8.4–10.5)
Chloride: 101 mEq/L (ref 96–112)
Creat: 0.75 mg/dL (ref 0.50–1.10)
GLUCOSE: 87 mg/dL (ref 70–99)
POTASSIUM: 3.8 meq/L (ref 3.5–5.3)
SODIUM: 137 meq/L (ref 135–145)
TOTAL PROTEIN: 6.7 g/dL (ref 6.0–8.3)

## 2013-12-12 LAB — HEPATITIS C ANTIBODY: HCV Ab: NEGATIVE

## 2013-12-12 LAB — VITAMIN D 25 HYDROXY (VIT D DEFICIENCY, FRACTURES): Vit D, 25-Hydroxy: 47 ng/mL (ref 30–89)

## 2013-12-17 ENCOUNTER — Other Ambulatory Visit: Payer: Self-pay | Admitting: Gynecology

## 2013-12-17 DIAGNOSIS — M810 Age-related osteoporosis without current pathological fracture: Secondary | ICD-10-CM

## 2014-01-03 ENCOUNTER — Ambulatory Visit (INDEPENDENT_AMBULATORY_CARE_PROVIDER_SITE_OTHER): Payer: Federal, State, Local not specified - PPO

## 2014-01-03 DIAGNOSIS — M810 Age-related osteoporosis without current pathological fracture: Secondary | ICD-10-CM

## 2014-05-04 IMAGING — MG MM DIGITAL SCREENING BILAT
4 series · 4 of 4 positions shown · non-contrast
Comparison: Previous exams.

CLINICAL DATA: Screening.

DIGITAL BILATERAL SCREENING MAMMOGRAM WITH CAD

[R CC]
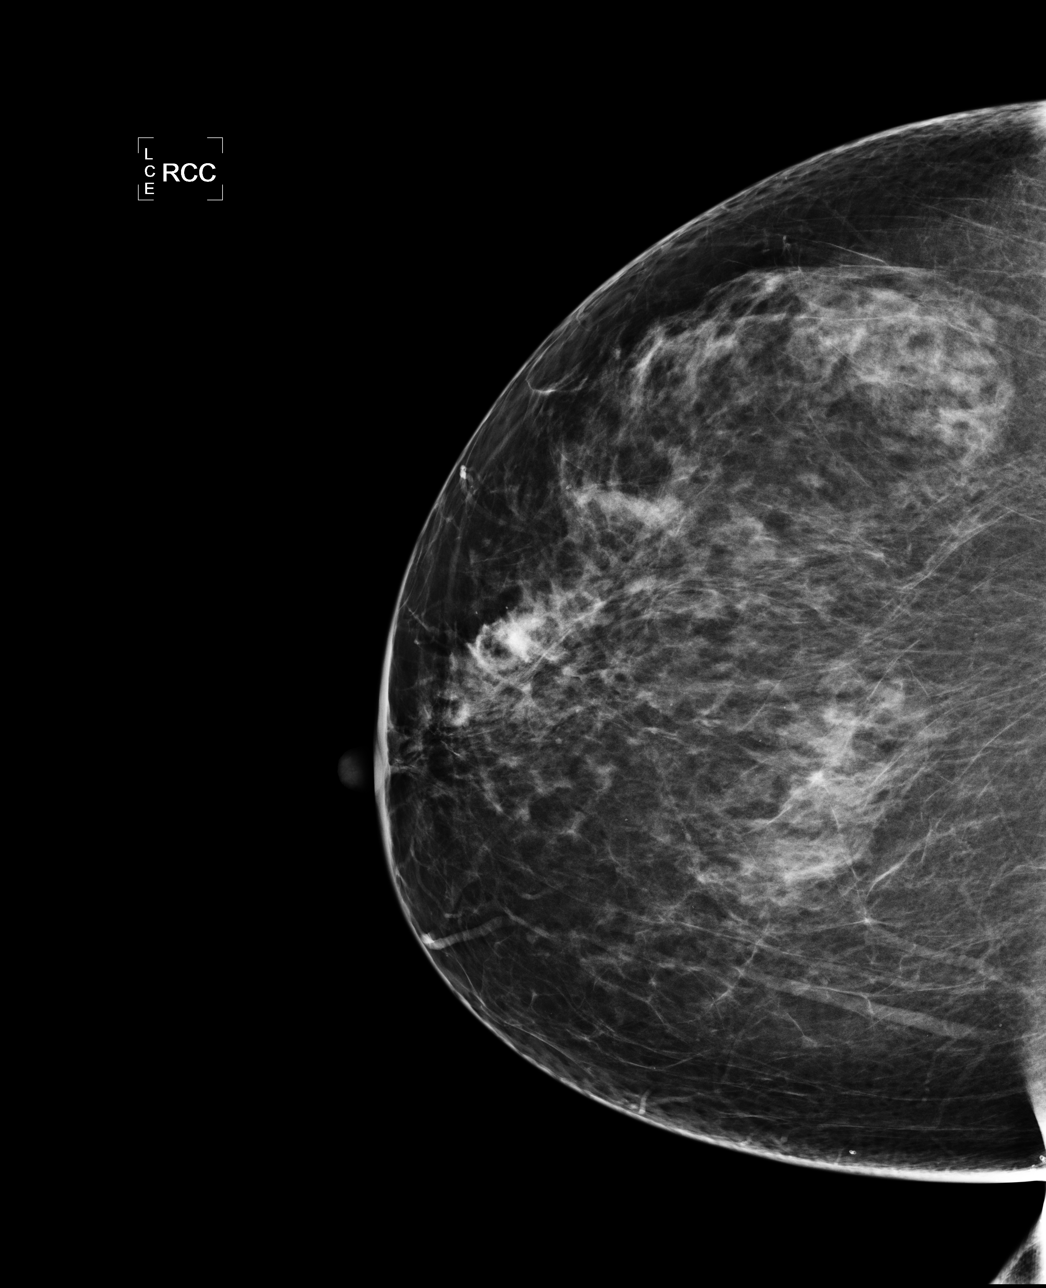

[L CC]
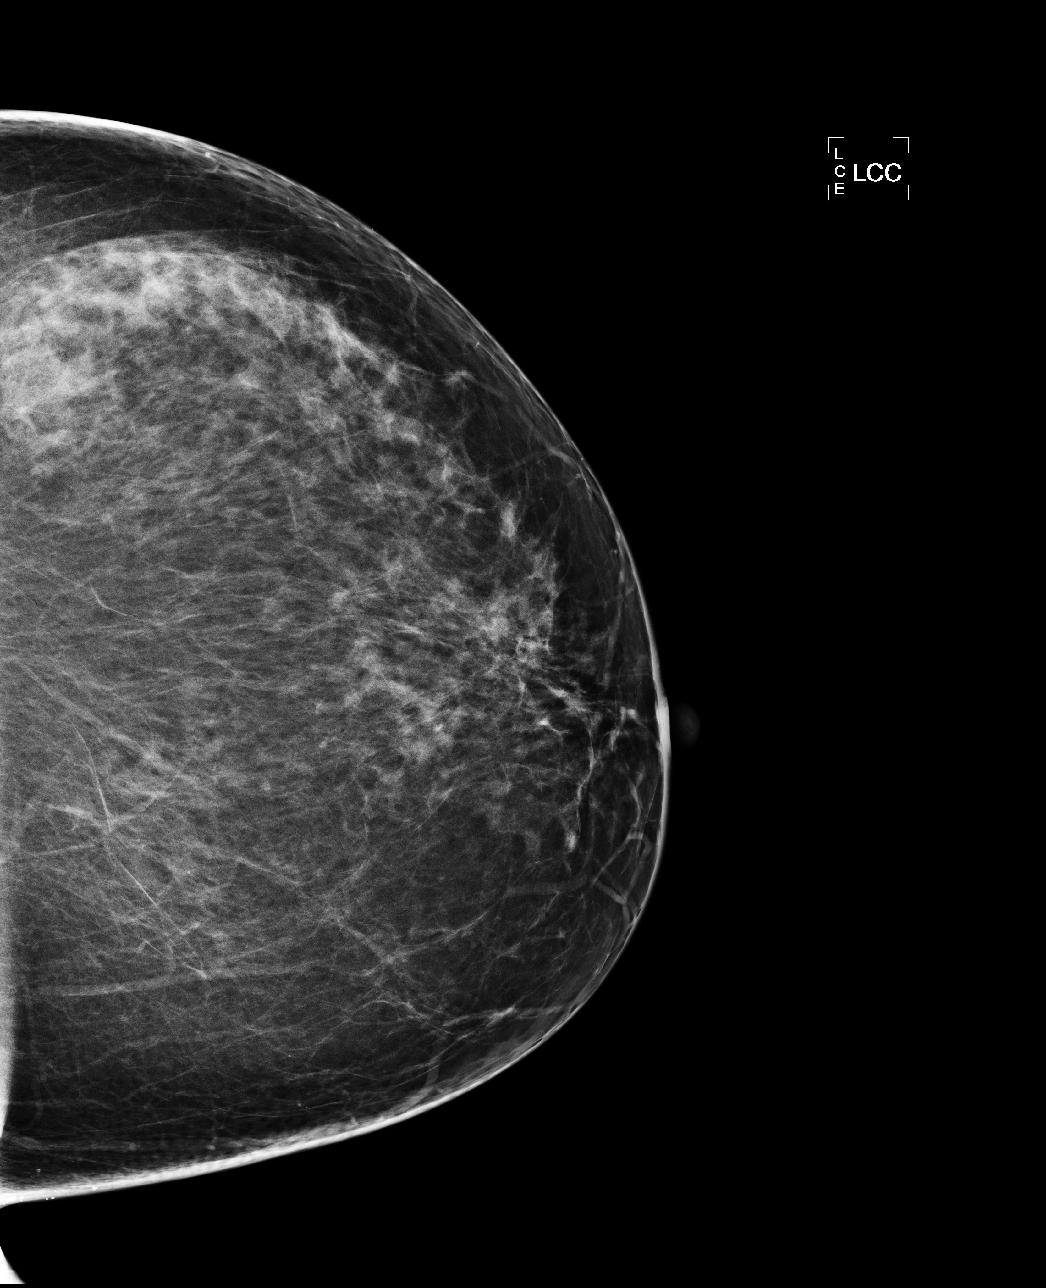

[L MLO]
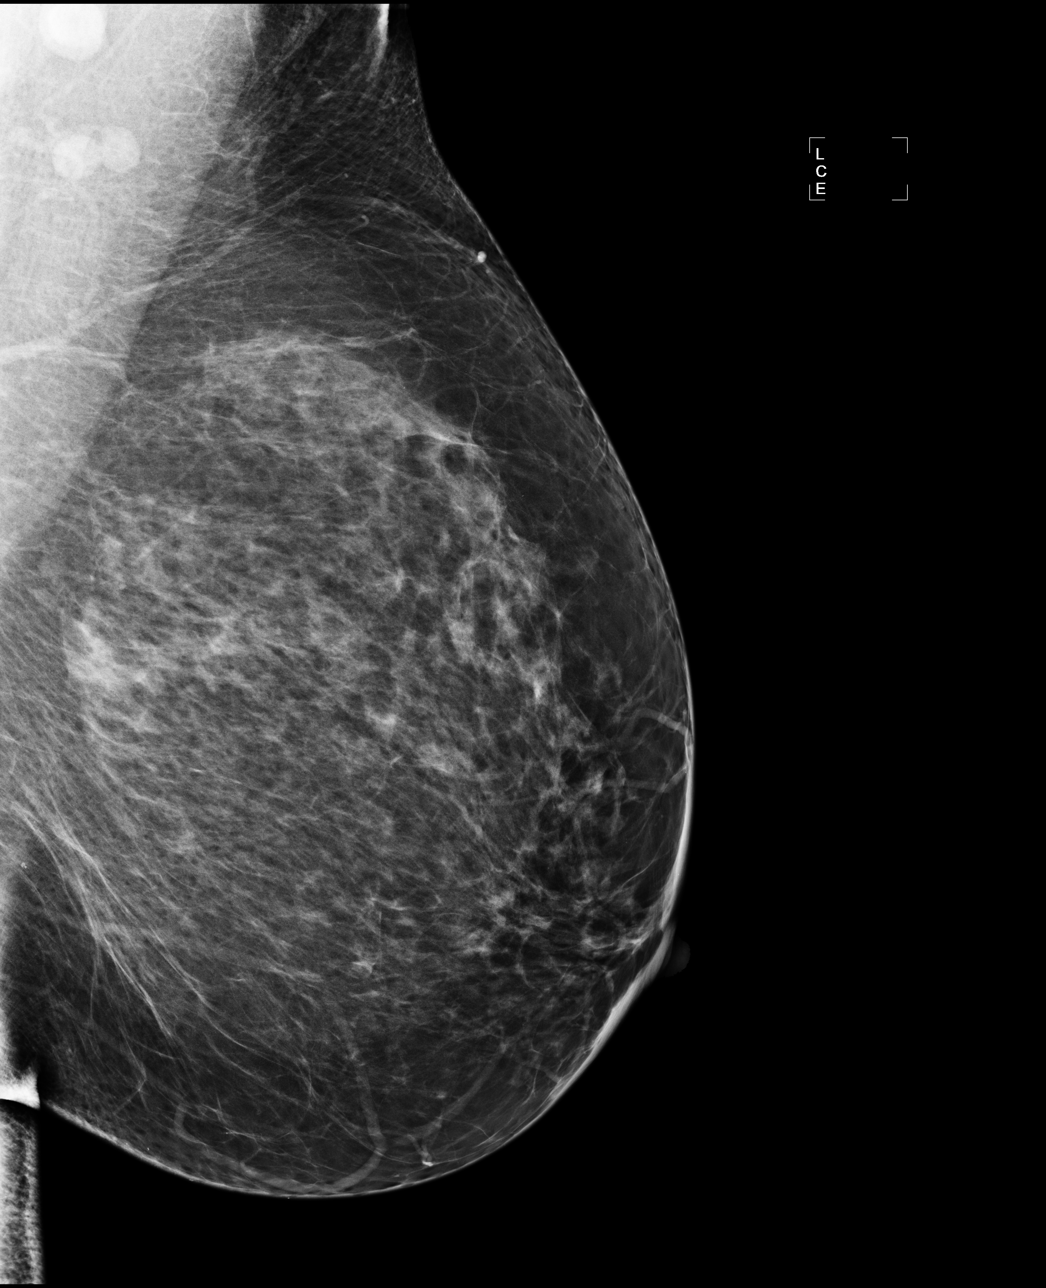

[R MLO]
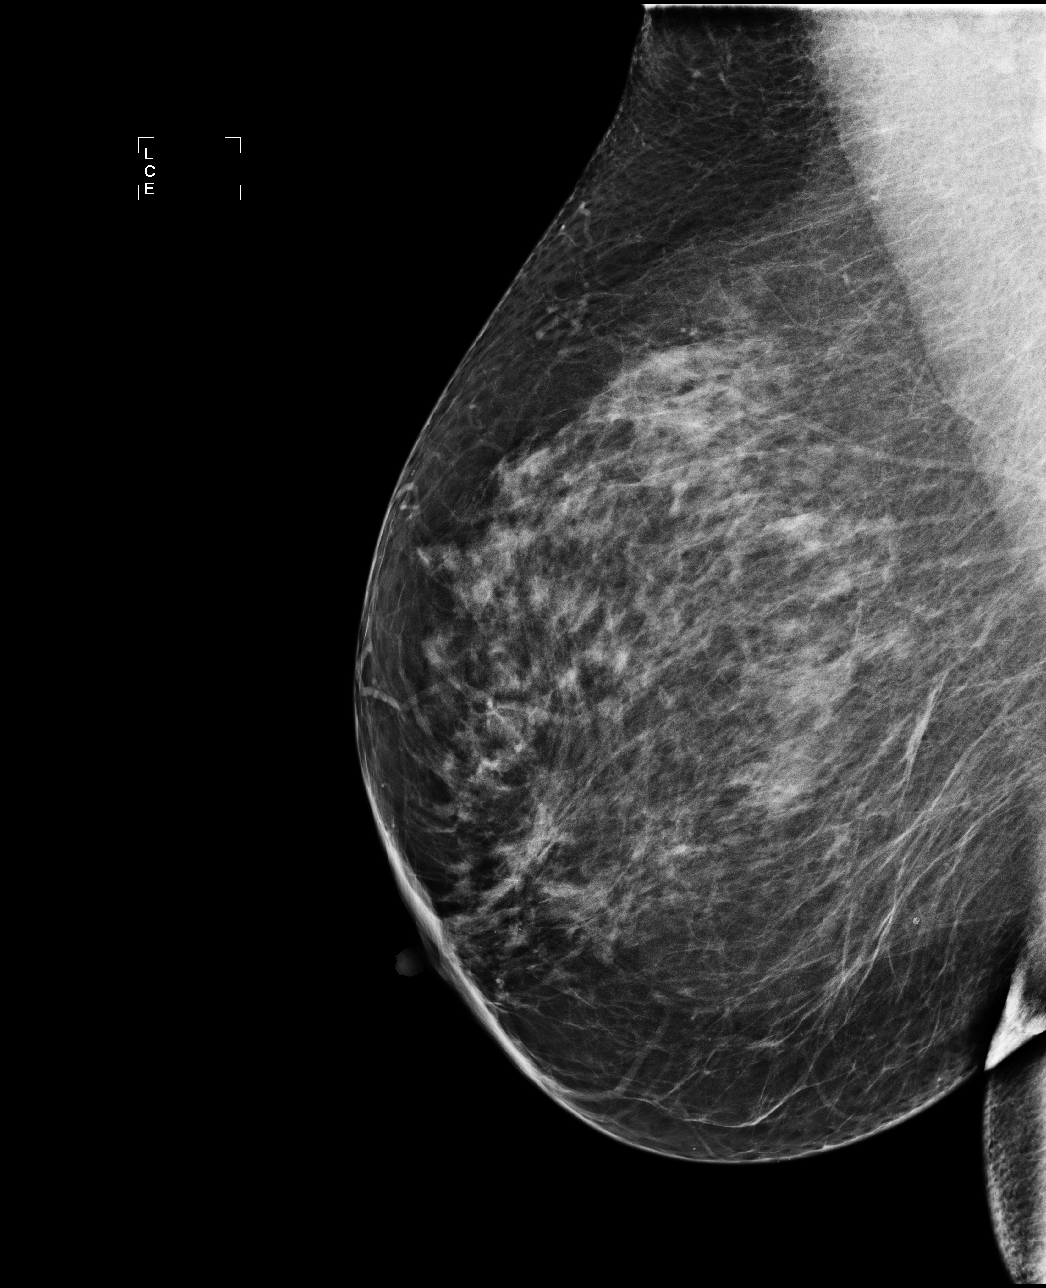

[4 of 4 positions shown; findings below may reference images not displayed]

FINDINGS: ACR Breast Density Category 2: There is a scattered fibroglandular
pattern.

No suspicious masses, architectural distortion, or calcifications
are present.

Images were processed with CAD.
IMPRESSION: No mammographic evidence of malignancy.

A result letter of this screening mammogram will be mailed directly
to the patient.

RECOMMENDATION:
Screening mammogram in one year. (Code:[id number])

BI-RADS CATEGORY 1:  Negative.

## 2014-06-21 ENCOUNTER — Telehealth: Payer: Self-pay | Admitting: *Deleted

## 2014-06-21 NOTE — Telephone Encounter (Signed)
Pt called due for Prolia. She wants to proceed. Benefits are same as earlier this year. She feels she has met her deductible therefore it will cost her 15%. ~$150. She is aware that if not met then she will have that portion to pay as well. Apt for 10/19 at [place]

## 2014-06-24 ENCOUNTER — Ambulatory Visit (INDEPENDENT_AMBULATORY_CARE_PROVIDER_SITE_OTHER): Payer: Federal, State, Local not specified - PPO | Admitting: Anesthesiology

## 2014-06-24 DIAGNOSIS — M810 Age-related osteoporosis without current pathological fracture: Secondary | ICD-10-CM

## 2014-06-24 MED ORDER — DENOSUMAB 60 MG/ML ~~LOC~~ SOLN
60.0000 mg | Freq: Once | SUBCUTANEOUS | Status: AC
  Administered 2014-06-24: 60 mg via SUBCUTANEOUS

## 2014-07-08 ENCOUNTER — Encounter: Payer: Self-pay | Admitting: Gynecology

## 2014-10-07 ENCOUNTER — Other Ambulatory Visit: Payer: Self-pay

## 2014-10-07 DIAGNOSIS — Z1231 Encounter for screening mammogram for malignant neoplasm of breast: Secondary | ICD-10-CM

## 2014-10-28 ENCOUNTER — Ambulatory Visit
Admission: RE | Admit: 2014-10-28 | Discharge: 2014-10-28 | Disposition: A | Discharge status: Home / Self Care | Payer: Federal, State, Local not specified - PPO | Source: Ambulatory Visit

## 2014-10-28 ENCOUNTER — Encounter (INDEPENDENT_AMBULATORY_CARE_PROVIDER_SITE_OTHER): Payer: Self-pay

## 2014-10-28 DIAGNOSIS — Z1231 Encounter for screening mammogram for malignant neoplasm of breast: Secondary | ICD-10-CM

## 2014-12-12 ENCOUNTER — Telehealth: Payer: Self-pay | Admitting: Gynecology

## 2014-12-12 NOTE — Telephone Encounter (Signed)
Phone call to pt , Prolia due after 12/25/14/ Patient would like to talk to Dr Toney Rakes before she receives another injection. She has appointment with Dr Toney Rakes on 12/13/14. She will discuss with him.   Insurance benefits are  No Deductible, co insurance 30% (approx $300), with OV $30 co-pay, OOP MAX $5500 ($0) met.   Calcium level 9.3 12/11/13.

## 2014-12-13 ENCOUNTER — Ambulatory Visit (INDEPENDENT_AMBULATORY_CARE_PROVIDER_SITE_OTHER): Payer: Federal, State, Local not specified - PPO | Admitting: Gynecology

## 2014-12-13 ENCOUNTER — Encounter: Payer: Self-pay | Admitting: Gynecology

## 2014-12-13 VITALS — BP 130/70 | Ht 61.0 in | Wt 177.0 lb

## 2014-12-13 DIAGNOSIS — Z01419 Encounter for gynecological examination (general) (routine) without abnormal findings: Secondary | ICD-10-CM

## 2014-12-13 DIAGNOSIS — M810 Age-related osteoporosis without current pathological fracture: Secondary | ICD-10-CM | POA: Diagnosis not present

## 2014-12-13 LAB — CBC WITH DIFFERENTIAL/PLATELET
BASOS PCT: 0 % (ref 0–1)
Basophils Absolute: 0 10*3/uL (ref 0.0–0.1)
EOS ABS: 0.1 10*3/uL (ref 0.0–0.7)
Eosinophils Relative: 1 % (ref 0–5)
HCT: 40.3 % (ref 36.0–46.0)
HEMOGLOBIN: 14 g/dL (ref 12.0–15.0)
Lymphocytes Relative: 38 % (ref 12–46)
Lymphs Abs: 2.7 10*3/uL (ref 0.7–4.0)
MCH: 30 pg (ref 26.0–34.0)
MCHC: 34.7 g/dL (ref 30.0–36.0)
MCV: 86.5 fL (ref 78.0–100.0)
MPV: 9.3 fL (ref 8.6–12.4)
Monocytes Absolute: 0.5 10*3/uL (ref 0.1–1.0)
Monocytes Relative: 7 % (ref 3–12)
NEUTROS PCT: 54 % (ref 43–77)
Neutro Abs: 3.9 10*3/uL (ref 1.7–7.7)
Platelets: 244 10*3/uL (ref 150–400)
RBC: 4.66 MIL/uL (ref 3.87–5.11)
RDW: 13.3 % (ref 11.5–15.5)
WBC: 7.2 10*3/uL (ref 4.0–10.5)

## 2014-12-13 LAB — COMPREHENSIVE METABOLIC PANEL
ALBUMIN: 4.3 g/dL (ref 3.5–5.2)
ALT: 16 U/L (ref 0–35)
AST: 21 U/L (ref 0–37)
Alkaline Phosphatase: 48 U/L (ref 39–117)
BUN: 11 mg/dL (ref 6–23)
CALCIUM: 9.6 mg/dL (ref 8.4–10.5)
CHLORIDE: 101 meq/L (ref 96–112)
CO2: 26 meq/L (ref 19–32)
Creat: 0.7 mg/dL (ref 0.50–1.10)
GLUCOSE: 81 mg/dL (ref 70–99)
POTASSIUM: 4.3 meq/L (ref 3.5–5.3)
Sodium: 138 mEq/L (ref 135–145)
Total Bilirubin: 0.6 mg/dL (ref 0.2–1.2)
Total Protein: 6.7 g/dL (ref 6.0–8.3)

## 2014-12-13 LAB — LIPID PANEL
CHOLESTEROL: 255 mg/dL — AB (ref 0–200)
HDL: 46 mg/dL (ref >=46)
LDL CALC: 179 mg/dL — AB (ref 0–99)
TRIGLYCERIDES: 152 mg/dL — AB (ref <150)
Total CHOL/HDL Ratio: 5.5 Ratio
VLDL: 30 mg/dL (ref 0–40)

## 2014-12-13 LAB — TSH: TSH: 1.812 u[IU]/mL (ref 0.350–4.500)

## 2014-12-13 NOTE — Progress Notes (Signed)
Cheyenne SkinnerKathy E Davis Nov 03, 1950 [id number]   History:    64 y.o.  for annual gyn exam with no complaints today.Review of patient's records indicated the following:  TAH/BSO 2002  Reclast initiated 2011 secondary to osteoporosis (patient cannot tolerate Evista, Actonel or Boniva)  Due to cost she is now on Prolia 60 mg subcutaneous every 6 months which were started in July 2014  Bone density 2013: Compare with 2011 significant improvement in the AP spine. Significant decrease of total left hip -8.7%. No significant decrease of right total hip T score.  Bone density 2015: Her lowest T score was at the left femoral neck with a value of -2.3. When compared with study of 2013 there was a statistically significant increase in bone mineralization of the right femoral neck and no significant change in the left femoral neck or AP spine  Colonoscopy 2011 past history of colon polyps  Mammogram normal 2015 Multinodular goiter been followed by Dr. Leslie DalesAltheimer (Past history partial thyroidectomy). She has not seen him in over 2 years Patient on no hormone replacement therapy/ asymptomatic  Patient's primary physician is Dr. Janace LittenGurley for which she has not seen in over year. Patient states she is taking her calcium and vitamin D. She received her Tdap vaccine in 2013. She has now received her shingles vaccine.   Past medical history,surgical history, family history and social history were all reviewed and documented in the EPIC chart.  Gynecologic History No LMP recorded. Patient has had a hysterectomy. Contraception: status post hysterectomy Last Pap: 2012. Results were: normal Last mammogram: 2016. Results were: normal  Obstetric History OB History  Gravida Para Term Preterm AB SAB TAB Ectopic Multiple Living  2 2 2   0     2    # Outcome Date GA Lbr Len/2nd Weight Sex Delivery Anes PTL Lv  2 Term           1 Term                ROS: A ROS was performed and pertinent positives and negatives  are included in the history.  GENERAL: No fevers or chills. HEENT: No change in vision, no earache, sore throat or sinus congestion. NECK: No pain or stiffness. CARDIOVASCULAR: No chest pain or pressure. No palpitations. PULMONARY: No shortness of breath, cough or wheeze. GASTROINTESTINAL: No abdominal pain, nausea, vomiting or diarrhea, melena or bright red blood per rectum. GENITOURINARY: No urinary frequency, urgency, hesitancy or dysuria. MUSCULOSKELETAL: No joint or muscle pain, no back pain, no recent trauma. DERMATOLOGIC: No rash, no itching, no lesions. ENDOCRINE: No polyuria, polydipsia, no heat or cold intolerance. No recent change in weight. HEMATOLOGICAL: No anemia or easy bruising or bleeding. NEUROLOGIC: No headache, seizures, numbness, tingling or weakness. PSYCHIATRIC: No depression, no loss of interest in normal activity or change in sleep pattern.     Exam: chaperone present  BP 130/70 mmHg  Ht 5\' 1"  (1.549 m)  Wt 177 lb (80.287 kg)  BMI 33.46 kg/m2  Body mass index is 33.46 kg/(m^2).  General appearance : Well developed well nourished female. No acute distress HEENT: Eyes: no retinal hemorrhage or exudates,  Neck supple, trachea midline, no carotid bruits, no thyroidmegaly Lungs: Clear to auscultation, no rhonchi or wheezes, or rib retractions  Heart: Regular rate and rhythm, no murmurs or gallops Breast:Examined in sitting and supine position were symmetrical in appearance, no palpable masses or tenderness,  no skin retraction, no nipple inversion, no nipple discharge, no skin discoloration,  no axillary or supraclavicular lymphadenopathy Abdomen: no palpable masses or tenderness, no rebound or guarding Extremities: no edema or skin discoloration or tenderness  Pelvic:  Bartholin, Urethra, Skene Glands: Within normal limits             Vagina: No gross lesions or discharge  Cervix: Absent  Uterus  absent,   Adnexa  Without masses or tenderness  Anus and perineum  normal    Rectovaginal  normal sphincter tone without palpated masses or tenderness             Hemoccult cards provided     Assessment/Plan:  64 y.o. female for annual exam with past history of osteoporosis between the time that she has been on the oral bisphosphonates and 2 years of monoclonal antibody Prolia in stable bone mineralization we are going to give her a drug holiday and check her bone density study next year. The following screening labs were ordered: Vitamin D level, CBC, comprehensive metabolic panel, TSH, fasting lipid profile and urinalysis. Pap smear not done in accordance to the new guidelines. Patient was reminded to do her monthly breast exam. Patient was reminded that next year she will need a three-dimensional mammogram as a result of her dense breasts noted on recent mammogram. We discussed importance of calcium vitamin D and weightbearing exercises for bone health.   Ok Edwards MD, 12:08 PM 12/13/2014

## 2014-12-14 LAB — URINALYSIS W MICROSCOPIC + REFLEX CULTURE
BACTERIA UA: NONE SEEN
Bilirubin Urine: NEGATIVE
CASTS: NONE SEEN
Crystals: NONE SEEN
GLUCOSE, UA: NEGATIVE mg/dL
Hgb urine dipstick: NEGATIVE
KETONES UR: NEGATIVE mg/dL
Leukocytes, UA: NEGATIVE
Nitrite: NEGATIVE
PROTEIN: NEGATIVE mg/dL
Specific Gravity, Urine: 1.005 (ref 1.005–1.030)
Squamous Epithelial / LPF: NONE SEEN
UROBILINOGEN UA: 0.2 mg/dL (ref 0.0–1.0)
pH: 6 (ref 5.0–8.0)

## 2014-12-14 LAB — VITAMIN D 25 HYDROXY (VIT D DEFICIENCY, FRACTURES): Vit D, 25-Hydroxy: 29 ng/mL — ABNORMAL LOW (ref 30–100)

## 2014-12-17 ENCOUNTER — Other Ambulatory Visit: Payer: Self-pay | Admitting: Gynecology

## 2014-12-17 DIAGNOSIS — E559 Vitamin D deficiency, unspecified: Secondary | ICD-10-CM

## 2014-12-17 MED ORDER — VITAMIN D (ERGOCALCIFEROL) 1.25 MG (50000 UNIT) PO CAPS: 50000.0000 [IU] | ORAL_CAPSULE | ORAL | Status: AC

## 2014-12-17 NOTE — Telephone Encounter (Signed)
Patient discussed with Dr Lily PeerFernandez. Will begin drug holiday this year per his note and recheck bone density 2017.

## 2015-01-02 ENCOUNTER — Telehealth: Payer: Self-pay | Admitting: *Deleted

## 2015-01-02 NOTE — Telephone Encounter (Signed)
Pt called and requested her Cholesterol results be sent to Dr Holley [facility]all [facility] at [facility]. Results from 12/13/14 sent. Also made apt for 03/18/15 for repeat Vit D. KW CMA

## 2015-01-29 ENCOUNTER — Other Ambulatory Visit: Payer: Federal, State, Local not specified - PPO | Admitting: Anesthesiology

## 2015-01-29 DIAGNOSIS — Z1211 Encounter for screening for malignant neoplasm of colon: Secondary | ICD-10-CM | POA: Diagnosis not present

## 2015-03-18 ENCOUNTER — Other Ambulatory Visit: Payer: Federal, State, Local not specified - PPO

## 2015-04-03 ENCOUNTER — Other Ambulatory Visit: Payer: Federal, State, Local not specified - PPO

## 2015-04-03 DIAGNOSIS — E559 Vitamin D deficiency, unspecified: Secondary | ICD-10-CM

## 2015-04-03 LAB — VITAMIN D 25 HYDROXY (VIT D DEFICIENCY, FRACTURES): VIT D 25 HYDROXY: 30 ng/mL (ref 30–100)

## 2015-10-03 ENCOUNTER — Other Ambulatory Visit: Payer: Self-pay

## 2015-10-03 DIAGNOSIS — Z1231 Encounter for screening mammogram for malignant neoplasm of breast: Secondary | ICD-10-CM

## 2015-11-04 ENCOUNTER — Ambulatory Visit
Admission: RE | Admit: 2015-11-04 | Discharge: 2015-11-04 | Disposition: A | Discharge status: Home / Self Care | Payer: Federal, State, Local not specified - PPO | Source: Ambulatory Visit

## 2015-11-04 DIAGNOSIS — Z1231 Encounter for screening mammogram for malignant neoplasm of breast: Secondary | ICD-10-CM

## 2015-12-18 ENCOUNTER — Encounter: Payer: Federal, State, Local not specified - PPO | Admitting: Gynecology

## 2016-01-15 ENCOUNTER — Ambulatory Visit (INDEPENDENT_AMBULATORY_CARE_PROVIDER_SITE_OTHER): Payer: Federal, State, Local not specified - PPO | Admitting: Gynecology

## 2016-01-15 ENCOUNTER — Encounter: Payer: Self-pay | Admitting: Gynecology

## 2016-01-15 VITALS — BP 136/78 | Ht 61.25 in | Wt 173.0 lb

## 2016-01-15 DIAGNOSIS — Z8639 Personal history of other endocrine, nutritional and metabolic disease: Secondary | ICD-10-CM

## 2016-01-15 DIAGNOSIS — M810 Age-related osteoporosis without current pathological fracture: Secondary | ICD-10-CM | POA: Diagnosis not present

## 2016-01-15 DIAGNOSIS — Z01419 Encounter for gynecological examination (general) (routine) without abnormal findings: Secondary | ICD-10-CM | POA: Diagnosis not present

## 2016-01-15 LAB — CBC WITH DIFFERENTIAL/PLATELET
BASOS PCT: 0 %
Basophils Absolute: 0 cells/uL (ref 0–200)
EOS PCT: 2 %
Eosinophils Absolute: 124 cells/uL (ref 15–500)
HEMATOCRIT: 39.5 % (ref 35.0–45.0)
Hemoglobin: 13.3 g/dL (ref 11.7–15.5)
LYMPHS PCT: 39 %
Lymphs Abs: 2418 cells/uL (ref 850–3900)
MCH: 29.8 pg (ref 27.0–33.0)
MCHC: 33.7 g/dL (ref 32.0–36.0)
MCV: 88.4 fL (ref 80.0–100.0)
MONO ABS: 372 {cells}/uL (ref 200–950)
MPV: 9.7 fL (ref 7.5–12.5)
Monocytes Relative: 6 %
Neutro Abs: 3286 cells/uL (ref 1500–7800)
Neutrophils Relative %: 53 %
PLATELETS: 235 10*3/uL (ref 140–400)
RBC: 4.47 MIL/uL (ref 3.80–5.10)
RDW: 13.8 % (ref 11.0–15.0)
WBC: 6.2 10*3/uL (ref 3.8–10.8)

## 2016-01-15 LAB — COMPREHENSIVE METABOLIC PANEL
ALK PHOS: 56 U/L (ref 33–130)
ALT: 16 U/L (ref 6–29)
AST: 23 U/L (ref 10–35)
Albumin: 4.2 g/dL (ref 3.6–5.1)
BILIRUBIN TOTAL: 0.6 mg/dL (ref 0.2–1.2)
BUN: 10 mg/dL (ref 7–25)
CALCIUM: 9.1 mg/dL (ref 8.6–10.4)
CO2: 25 mmol/L (ref 20–31)
CREATININE: 0.74 mg/dL (ref 0.50–0.99)
Chloride: 104 mmol/L (ref 98–110)
GLUCOSE: 74 mg/dL (ref 65–99)
Potassium: 3.9 mmol/L (ref 3.5–5.3)
SODIUM: 141 mmol/L (ref 135–146)
Total Protein: 6.5 g/dL (ref 6.1–8.1)

## 2016-01-15 LAB — LIPID PANEL
Cholesterol: 234 mg/dL — ABNORMAL HIGH (ref 125–200)
HDL: 53 mg/dL (ref >=46)
LDL CALC: 157 mg/dL — AB (ref <130)
Total CHOL/HDL Ratio: 4.4 Ratio (ref <=5.0)
Triglycerides: 118 mg/dL (ref <150)
VLDL: 24 mg/dL (ref <30)

## 2016-01-15 LAB — TSH: TSH: 1.81 m[IU]/L

## 2016-01-15 NOTE — Progress Notes (Signed)
Cheyenne SkinnerKathy E Quach 1951/05/15 [id number]   History:    65 y.o.  for annual gyn exam with no complaints today. Review of patient's record indicated the following: TAH/BSO 2002  Reclast initiated 2011 secondary to osteoporosis (patient cannot tolerate Evista, Actonel or Boniva)  Due to cost she is now on Prolia 60 mg subcutaneous every 6 months which were started in July 2014  Bone density 2013: Compare with 2011 significant improvement in the AP spine. Significant decrease of total left hip -8.7%. No significant decrease of right total hip T score.  Bone density 2015: Her lowest T score was at the left femoral neck with a value of -2.3. When compared with study of 2013 there was a statistically significant increase in bone mineralization of the right femoral neck and no significant change in the left femoral neck or AP spine. Patient currently on a drug holiday  Patient had a colonoscopy 2016 benign polyps were removed she is on a 5 year recall. Multinodular goiter been followed by Dr. Leslie DalesAltheimer (Past history partial thyroidectomy). She has not seen him in over 2 years Patient on no hormone replacement therapy/ asymptomatic  Patient's primary physician is Dr. Janace LittenGurley for which she has not seen in over year. Patient states she is taking her calcium and vitamin D. She received her Tdap vaccine in 2013. She has now received her shingles vaccine.     Past medical history,surgical history, family history and social history were all reviewed and documented in the EPIC chart.  Gynecologic History No LMP recorded. Patient has had a hysterectomy. Contraception: status post hysterectomy Last Pap: 2012. Results were: normal Last mammogram: 2017. Results were: normal  Obstetric History OB History  Gravida Para Term Preterm AB SAB TAB Ectopic Multiple Living  2 2 2   0     2    # Outcome Date GA Lbr Len/2nd Weight Sex Delivery Anes PTL Lv  2 Term           1 Term                ROS: A ROS  was performed and pertinent positives and negatives are included in the history.  GENERAL: No fevers or chills. HEENT: No change in vision, no earache, sore throat or sinus congestion. NECK: No pain or stiffness. CARDIOVASCULAR: No chest pain or pressure. No palpitations. PULMONARY: No shortness of breath, cough or wheeze. GASTROINTESTINAL: No abdominal pain, nausea, vomiting or diarrhea, melena or bright red blood per rectum. GENITOURINARY: No urinary frequency, urgency, hesitancy or dysuria. MUSCULOSKELETAL: No joint or muscle pain, no back pain, no recent trauma. DERMATOLOGIC: No rash, no itching, no lesions. ENDOCRINE: No polyuria, polydipsia, no heat or cold intolerance. No recent change in weight. HEMATOLOGICAL: No anemia or easy bruising or bleeding. NEUROLOGIC: No headache, seizures, numbness, tingling or weakness. PSYCHIATRIC: No depression, no loss of interest in normal activity or change in sleep pattern.     Exam: chaperone present  BP 136/78 mmHg  Ht 5' 1.25" (1.556 m)  Wt 173 lb (78.472 kg)  BMI 32.41 kg/m2  Body mass index is 32.41 kg/(m^2).  General appearance : Well developed well nourished female. No acute distress HEENT: Eyes: no retinal hemorrhage or exudates,  Neck supple, trachea midline, no carotid bruits, no thyroidmegaly Lungs: Clear to auscultation, no rhonchi or wheezes, or rib retractions  Heart: Regular rate and rhythm, no murmurs or gallops Breast:Examined in sitting and supine position were symmetrical in appearance, no palpable masses or tenderness,  no skin retraction, no nipple inversion, no nipple discharge, no skin discoloration, no axillary or supraclavicular lymphadenopathy Abdomen: no palpable masses or tenderness, no rebound or guarding Extremities: no edema or skin discoloration or tenderness  Pelvic:  Bartholin, Urethra, Skene Glands: Within normal limits             Vagina: No gross lesions or discharge, atrophic changes  Cervix: Absent  Uterus   absent  Adnexa  Without masses or tenderness  Anus and perineum  normal   Rectovaginal  normal sphincter tone without palpated masses or tenderness             Hemoccult cards provided     Assessment/Plan:  65 y.o. female for annual exam menopausal on no hormone replacement therapy. History of osteoporosis on drug holiday see above for details. Patient to schedule her bone density study this year. She is encouraged to take her calcium vitamin D daily along with weightbearing exercises. The following screening blood work was ordered today: Because of her history vitamin D deficiency in the past and osteoporosis a vitamin D level will be obtained today along with the following screening blood work: Comprehensive metabolic panel, fasting lipid profile, TSH, CBC, and urinalysis. Pap smear not indicated. She was provided with Hemoccult cards to submit to the office for testing.   Ok Edwards MD, 9:50 AM 01/15/2016

## 2016-01-15 NOTE — Patient Instructions (Signed)

## 2016-01-16 LAB — URINALYSIS W MICROSCOPIC + REFLEX CULTURE
Bacteria, UA: NONE SEEN [HPF]
Bilirubin Urine: NEGATIVE
CASTS: NONE SEEN [LPF]
CRYSTALS: NONE SEEN [HPF]
Glucose, UA: NEGATIVE
Hgb urine dipstick: NEGATIVE
Ketones, ur: NEGATIVE
NITRITE: NEGATIVE
PH: 7 (ref 5.0–8.0)
Protein, ur: NEGATIVE
RBC / HPF: NONE SEEN RBC/HPF (ref <=2)
SPECIFIC GRAVITY, URINE: 1.003 (ref 1.001–1.035)
Squamous Epithelial / LPF: NONE SEEN [HPF] (ref <=5)
YEAST: NONE SEEN [HPF]

## 2016-01-16 LAB — VITAMIN D 25 HYDROXY (VIT D DEFICIENCY, FRACTURES): VIT D 25 HYDROXY: 44 ng/mL (ref 30–100)

## 2016-01-17 LAB — URINE CULTURE

## 2016-01-22 ENCOUNTER — Other Ambulatory Visit: Payer: Self-pay | Admitting: Gynecology

## 2016-01-22 MED ORDER — CEFUROXIME AXETIL 250 MG PO TABS: 250.0000 mg | ORAL_TABLET | Freq: Two times a day (BID) | ORAL | Status: AC

## 2016-01-27 ENCOUNTER — Ambulatory Visit (INDEPENDENT_AMBULATORY_CARE_PROVIDER_SITE_OTHER): Payer: Federal, State, Local not specified - PPO

## 2016-01-27 DIAGNOSIS — M810 Age-related osteoporosis without current pathological fracture: Secondary | ICD-10-CM

## 2016-01-27 DIAGNOSIS — Z8639 Personal history of other endocrine, nutritional and metabolic disease: Secondary | ICD-10-CM

## 2016-02-10 ENCOUNTER — Other Ambulatory Visit: Payer: Self-pay | Admitting: Anesthesiology

## 2016-02-10 DIAGNOSIS — Z1211 Encounter for screening for malignant neoplasm of colon: Secondary | ICD-10-CM

## 2016-10-12 ENCOUNTER — Other Ambulatory Visit: Payer: Self-pay | Admitting: Physician Assistant

## 2016-10-12 DIAGNOSIS — Z1231 Encounter for screening mammogram for malignant neoplasm of breast: Secondary | ICD-10-CM

## 2016-12-07 ENCOUNTER — Ambulatory Visit: Payer: Federal, State, Local not specified - PPO

## 2016-12-07 ENCOUNTER — Ambulatory Visit
Admission: RE | Admit: 2016-12-07 | Discharge: 2016-12-07 | Disposition: A | Discharge status: Home / Self Care | Payer: Federal, State, Local not specified - PPO | Source: Ambulatory Visit | Attending: Physician Assistant | Admitting: Physician Assistant

## 2016-12-07 DIAGNOSIS — Z1231 Encounter for screening mammogram for malignant neoplasm of breast: Secondary | ICD-10-CM

## 2017-01-19 ENCOUNTER — Encounter: Payer: Self-pay | Admitting: Gynecology

## 2017-01-20 ENCOUNTER — Encounter: Payer: Federal, State, Local not specified - PPO | Admitting: Gynecology

## 2017-11-28 ENCOUNTER — Other Ambulatory Visit: Payer: Self-pay | Admitting: Family Medicine

## 2017-11-28 DIAGNOSIS — Z1231 Encounter for screening mammogram for malignant neoplasm of breast: Secondary | ICD-10-CM

## 2017-12-13 ENCOUNTER — Ambulatory Visit: Payer: Federal, State, Local not specified - PPO

## 2017-12-14 ENCOUNTER — Ambulatory Visit
Admission: RE | Admit: 2017-12-14 | Discharge: 2017-12-14 | Disposition: A | Discharge status: Home / Self Care | Payer: Federal, State, Local not specified - PPO | Source: Ambulatory Visit | Attending: Family Medicine | Admitting: Family Medicine

## 2017-12-14 ENCOUNTER — Ambulatory Visit: Payer: Federal, State, Local not specified - PPO

## 2017-12-14 DIAGNOSIS — Z1231 Encounter for screening mammogram for malignant neoplasm of breast: Secondary | ICD-10-CM

## 2018-11-13 ENCOUNTER — Other Ambulatory Visit: Payer: Self-pay | Admitting: Family Medicine

## 2018-11-13 DIAGNOSIS — Z1231 Encounter for screening mammogram for malignant neoplasm of breast: Secondary | ICD-10-CM

## 2018-12-21 ENCOUNTER — Ambulatory Visit: Payer: Federal, State, Local not specified - PPO

## 2019-02-06 ENCOUNTER — Other Ambulatory Visit: Payer: Self-pay

## 2019-02-06 ENCOUNTER — Ambulatory Visit
Admission: RE | Admit: 2019-02-06 | Discharge: 2019-02-06 | Disposition: A | Discharge status: Home / Self Care | Payer: Federal, State, Local not specified - PPO | Source: Ambulatory Visit | Attending: Family Medicine | Admitting: Family Medicine

## 2019-02-06 DIAGNOSIS — Z1231 Encounter for screening mammogram for malignant neoplasm of breast: Secondary | ICD-10-CM

## 2019-06-19 ENCOUNTER — Other Ambulatory Visit: Payer: Self-pay | Admitting: Family Medicine

## 2019-06-19 DIAGNOSIS — M810 Age-related osteoporosis without current pathological fracture: Secondary | ICD-10-CM

## 2019-08-13 ENCOUNTER — Ambulatory Visit
Admission: RE | Admit: 2019-08-13 | Discharge: 2019-08-13 | Disposition: A | Discharge status: Home / Self Care | Payer: Federal, State, Local not specified - PPO | Source: Ambulatory Visit | Attending: Family Medicine | Admitting: Family Medicine

## 2019-08-13 ENCOUNTER — Other Ambulatory Visit: Payer: Self-pay

## 2019-08-13 DIAGNOSIS — M810 Age-related osteoporosis without current pathological fracture: Secondary | ICD-10-CM

## 2020-01-08 ENCOUNTER — Other Ambulatory Visit: Payer: Self-pay | Admitting: Family Medicine

## 2020-01-08 DIAGNOSIS — Z1231 Encounter for screening mammogram for malignant neoplasm of breast: Secondary | ICD-10-CM

## 2020-02-07 ENCOUNTER — Other Ambulatory Visit: Payer: Self-pay

## 2020-02-07 ENCOUNTER — Ambulatory Visit
Admission: RE | Admit: 2020-02-07 | Discharge: 2020-02-07 | Disposition: A | Discharge status: Home / Self Care | Payer: Federal, State, Local not specified - PPO | Source: Ambulatory Visit | Attending: Family Medicine | Admitting: Family Medicine

## 2020-02-07 DIAGNOSIS — Z1231 Encounter for screening mammogram for malignant neoplasm of breast: Secondary | ICD-10-CM

## 2021-01-09 ENCOUNTER — Other Ambulatory Visit: Payer: Self-pay | Admitting: Family Medicine

## 2021-01-09 DIAGNOSIS — Z1231 Encounter for screening mammogram for malignant neoplasm of breast: Secondary | ICD-10-CM

## 2021-03-06 ENCOUNTER — Ambulatory Visit
Admission: RE | Admit: 2021-03-06 | Discharge: 2021-03-06 | Disposition: A | Discharge status: Home / Self Care | Payer: Federal, State, Local not specified - PPO | Source: Ambulatory Visit | Attending: Family Medicine | Admitting: Family Medicine

## 2021-03-06 ENCOUNTER — Other Ambulatory Visit: Payer: Self-pay

## 2021-03-06 DIAGNOSIS — Z1231 Encounter for screening mammogram for malignant neoplasm of breast: Secondary | ICD-10-CM

## 2021-07-17 ENCOUNTER — Other Ambulatory Visit: Payer: Self-pay | Admitting: Family Medicine

## 2021-07-17 DIAGNOSIS — M810 Age-related osteoporosis without current pathological fracture: Secondary | ICD-10-CM

## 2021-12-15 ENCOUNTER — Other Ambulatory Visit: Payer: Self-pay | Admitting: Family Medicine

## 2021-12-15 DIAGNOSIS — M810 Age-related osteoporosis without current pathological fracture: Secondary | ICD-10-CM

## 2021-12-23 ENCOUNTER — Ambulatory Visit
Admission: RE | Admit: 2021-12-23 | Discharge: 2021-12-23 | Disposition: A | Discharge status: Home / Self Care | Payer: Federal, State, Local not specified - PPO | Source: Ambulatory Visit | Attending: Family Medicine | Admitting: Family Medicine

## 2021-12-23 DIAGNOSIS — M810 Age-related osteoporosis without current pathological fracture: Secondary | ICD-10-CM

## 2022-01-26 ENCOUNTER — Other Ambulatory Visit: Payer: Self-pay | Admitting: Family Medicine

## 2022-01-26 DIAGNOSIS — Z1231 Encounter for screening mammogram for malignant neoplasm of breast: Secondary | ICD-10-CM

## 2022-03-15 ENCOUNTER — Ambulatory Visit
Admission: RE | Admit: 2022-03-15 | Discharge: 2022-03-15 | Disposition: A | Discharge status: Home / Self Care | Payer: Federal, State, Local not specified - PPO | Source: Ambulatory Visit | Attending: Family Medicine | Admitting: Family Medicine

## 2022-03-15 DIAGNOSIS — Z1231 Encounter for screening mammogram for malignant neoplasm of breast: Secondary | ICD-10-CM

## 2022-05-25 ENCOUNTER — Other Ambulatory Visit: Payer: Self-pay | Admitting: Family Medicine

## 2022-05-25 DIAGNOSIS — R1310 Dysphagia, unspecified: Secondary | ICD-10-CM

## 2022-06-01 ENCOUNTER — Ambulatory Visit
Admission: RE | Admit: 2022-06-01 | Discharge: 2022-06-01 | Disposition: A | Discharge status: Home / Self Care | Payer: Federal, State, Local not specified - PPO | Source: Ambulatory Visit | Attending: Family Medicine | Admitting: Family Medicine

## 2022-06-01 DIAGNOSIS — R1310 Dysphagia, unspecified: Secondary | ICD-10-CM

## 2023-02-11 ENCOUNTER — Other Ambulatory Visit: Payer: Self-pay | Admitting: Family Medicine

## 2023-02-11 DIAGNOSIS — Z1231 Encounter for screening mammogram for malignant neoplasm of breast: Secondary | ICD-10-CM

## 2023-03-17 ENCOUNTER — Ambulatory Visit
Admission: RE | Admit: 2023-03-17 | Discharge: 2023-03-17 | Disposition: A | Discharge status: Home / Self Care | Payer: Federal, State, Local not specified - PPO | Source: Ambulatory Visit | Attending: Family Medicine | Admitting: Family Medicine

## 2023-03-17 DIAGNOSIS — Z1231 Encounter for screening mammogram for malignant neoplasm of breast: Secondary | ICD-10-CM

## 2023-03-18 ENCOUNTER — Other Ambulatory Visit: Payer: Self-pay | Admitting: Family Medicine

## 2023-03-18 DIAGNOSIS — R928 Other abnormal and inconclusive findings on diagnostic imaging of breast: Secondary | ICD-10-CM

## 2023-03-24 ENCOUNTER — Ambulatory Visit
Admission: RE | Admit: 2023-03-24 | Discharge: 2023-03-24 | Disposition: A | Discharge status: Home / Self Care | Payer: Federal, State, Local not specified - PPO | Source: Ambulatory Visit | Attending: Family Medicine | Admitting: Family Medicine

## 2023-03-24 DIAGNOSIS — R928 Other abnormal and inconclusive findings on diagnostic imaging of breast: Secondary | ICD-10-CM

## 2023-03-25 ENCOUNTER — Other Ambulatory Visit: Payer: Self-pay | Admitting: Family Medicine

## 2023-03-25 DIAGNOSIS — R921 Mammographic calcification found on diagnostic imaging of breast: Secondary | ICD-10-CM

## 2023-03-25 DIAGNOSIS — R928 Other abnormal and inconclusive findings on diagnostic imaging of breast: Secondary | ICD-10-CM

## 2023-03-31 ENCOUNTER — Ambulatory Visit
Admission: RE | Admit: 2023-03-31 | Discharge: 2023-03-31 | Disposition: A | Discharge status: Home / Self Care | Payer: Federal, State, Local not specified - PPO | Source: Ambulatory Visit | Attending: Family Medicine | Admitting: Family Medicine

## 2023-03-31 DIAGNOSIS — R928 Other abnormal and inconclusive findings on diagnostic imaging of breast: Secondary | ICD-10-CM

## 2023-03-31 DIAGNOSIS — R921 Mammographic calcification found on diagnostic imaging of breast: Secondary | ICD-10-CM

## 2023-03-31 HISTORY — PX: BREAST BIOPSY: SHX20

## 2024-02-13 ENCOUNTER — Encounter: Payer: Self-pay | Admitting: Family Medicine

## 2024-02-13 DIAGNOSIS — Z1231 Encounter for screening mammogram for malignant neoplasm of breast: Secondary | ICD-10-CM

## 2024-02-17 ENCOUNTER — Other Ambulatory Visit: Payer: Self-pay | Admitting: Family Medicine

## 2024-02-17 DIAGNOSIS — N644 Mastodynia: Secondary | ICD-10-CM

## 2024-04-02 ENCOUNTER — Ambulatory Visit
Admission: RE | Admit: 2024-04-02 | Discharge: 2024-04-02 | Disposition: A | Discharge status: Home / Self Care | Source: Ambulatory Visit | Attending: Family Medicine

## 2024-04-02 ENCOUNTER — Ambulatory Visit
Admission: RE | Admit: 2024-04-02 | Discharge: 2024-04-02 | Disposition: A | Discharge status: Home / Self Care | Source: Ambulatory Visit | Attending: Family Medicine | Admitting: Family Medicine

## 2024-04-02 DIAGNOSIS — N644 Mastodynia: Secondary | ICD-10-CM
